# Patient Record
Sex: Male | Born: 1960 | Race: White | Hispanic: No | Marital: Married | Smoking: Never smoker
Health system: Southern US, Community
[De-identification: ages and names within clinical notes are randomized; demographics above are authoritative.]

## PROBLEM LIST (undated history)

## (undated) DIAGNOSIS — E785 Hyperlipidemia, unspecified: Secondary | ICD-10-CM

## (undated) DIAGNOSIS — K219 Gastro-esophageal reflux disease without esophagitis: Secondary | ICD-10-CM

## (undated) DIAGNOSIS — G4730 Sleep apnea, unspecified: Secondary | ICD-10-CM

## (undated) DIAGNOSIS — I10 Essential (primary) hypertension: Secondary | ICD-10-CM

## (undated) DIAGNOSIS — K635 Polyp of colon: Secondary | ICD-10-CM

## (undated) DIAGNOSIS — M109 Gout, unspecified: Secondary | ICD-10-CM

## (undated) DIAGNOSIS — F419 Anxiety disorder, unspecified: Secondary | ICD-10-CM

## (undated) DIAGNOSIS — I4891 Unspecified atrial fibrillation: Secondary | ICD-10-CM

## (undated) HISTORY — DX: Unspecified atrial fibrillation: I48.91

## (undated) HISTORY — DX: Hyperlipidemia, unspecified: E78.5

## (undated) HISTORY — PX: LEG SURGERY: SHX1003

## (undated) HISTORY — DX: Sleep apnea, unspecified: G47.30

## (undated) HISTORY — PX: HIP SURGERY: SHX245

## (undated) HISTORY — PX: CARDIAC ELECTROPHYSIOLOGY MAPPING AND ABLATION: SHX1292

## (undated) HISTORY — PX: CARDIOVERSION: SHX1299

## (undated) HISTORY — DX: Polyp of colon: K63.5

## (undated) HISTORY — DX: Gout, unspecified: M10.9

## (undated) HISTORY — DX: Anxiety disorder, unspecified: F41.9

## (undated) HISTORY — DX: Essential (primary) hypertension: I10

---

## 2011-04-04 ENCOUNTER — Encounter: Payer: Self-pay | Admitting: Gastroenterology

## 2011-04-04 ENCOUNTER — Ambulatory Visit (INDEPENDENT_AMBULATORY_CARE_PROVIDER_SITE_OTHER): Payer: 59 | Admitting: Gastroenterology

## 2011-04-04 DIAGNOSIS — K219 Gastro-esophageal reflux disease without esophagitis: Secondary | ICD-10-CM

## 2011-04-04 DIAGNOSIS — Z809 Family history of malignant neoplasm, unspecified: Secondary | ICD-10-CM

## 2011-04-04 MED ORDER — PEG-KCL-NACL-NASULF-NA ASC-C 100 G PO SOLR: 1.0000 | ORAL | Status: DC

## 2011-04-04 NOTE — Patient Instructions (Signed)
You will be set up for a colonoscopy. We will check to see if it makes a difference in copay at [facility] or [facility]. Needs to be done with propofol. Cutting back on daily alcohol intake is probably a good idea.

## 2011-04-04 NOTE — Progress Notes (Signed)
HPI: This is a  very pleasant 50 year old man who is here with his wife today.  His father died of colon cancer in his late 72s.  He has never had colon problems: no bleeding, no constipation, dramatic diarrhea.  Overall he has lost 12 pounds intentionally.  Gained a lot of weight after falling from a ladder, broke several bones, laid up for several months.  He drinks 6-7 beers a day. 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 He has no dysphasia, no overt GI bleeding.    Review of systems: Pertinent positive and negative review of systems were noted in the above HPI section.  All other review of systems was otherwise negative.   Past Medical History, Past Surgical History, Family History, Social History, Current Medications, Allergies were all reviewed with the patient via [facility] electronic medical record system.   Physical Exam: BP 132/80  Pulse 92  Ht 6' (1.829 m)  Wt 238 lb (107.956 kg)  BMI 32.28 kg/m2 Constitutional: generally well-appearing Psychiatric: alert and oriented x3 Eyes: extraocular movements intact Mouth: oral pharynx moist, no lesions Neck: supple no lymphadenopathy Cardiovascular: heart regular rate and rhythm Lungs: clear to auscultation bilaterally Abdomen: soft, nontender, nondistended, no obvious ascites, no peritoneal signs, normal bowel sounds Extremities: no lower extremity edema bilaterally Skin: no lesions on visible extremities    Assessment and plan: 50 y.o. male with family history of colon cancer  He drinks 6-8 beers a day and any endoscopic procedure would probably be safest with propofol. We will schedule that to be done at his soonest convenience. He does have chronic intermittent GERD but no alarm symptoms. I recommended he cut back on his alcohol intake as that is undoubtedly contributing to his heartburn. I do not think he needs endoscopic evaluation at this point he has no signs or symptoms of  chronic liver disease.

## 2011-05-05 ENCOUNTER — Other Ambulatory Visit: Payer: 59 | Admitting: Gastroenterology

## 2015-12-15 ENCOUNTER — Telehealth: Payer: Self-pay | Admitting: Internal Medicine

## 2015-12-15 NOTE — Telephone Encounter (Signed)
LMOM for a return call.  

## 2015-12-15 NOTE — Telephone Encounter (Signed)
Pt has been scheduled an OV with Walden Field, NP for 12/18/2015 at 10:30 AM.  He has [facility] and per Marzetta Board he does not need a referral.

## 2015-12-15 NOTE — Telephone Encounter (Signed)
[phone] or [phone] [facility], wife) calling to set up husband's first colonoscopy and she said he is not having any problems and was advised to have his list of meds, insurance card available and the triage nurse would be in touch.

## 2015-12-15 NOTE — Telephone Encounter (Signed)
I be happy to take him on as a new patient. We'll need to get him in to see [place] in preparation for TCS with propofol

## 2015-12-15 NOTE — Telephone Encounter (Signed)
I called and spoke to pt's wife, Jenny Reichmann. She said pt has never had a colonoscopy. He does have a family hx of colon cancer in his father who died from colon cancer in his early 22's.  He saw Dr. Ardis Hughs at [facility] in 03/2011 and never did do the colonoscopy. He said the prep was too extreme. ( See copy in chart). He would like Dr. Gala Romney to do the colonoscopy. Michela Pitcher a friend of his Charlann Boxer sees Dr. Gala Romney and really does like him and recommended him. I told Jenny Reichmann I would have to check with my office since I am not to schedule appts if a pt has seen another GI. She would like a call back as soon as possible, but said PT WILL NOT GO BACK TO THAT OFFICE. Sending to Sofie Rower to advise!

## 2015-12-15 NOTE — Telephone Encounter (Signed)
Routing to Dr. Gala Romney for his opinion

## 2015-12-18 ENCOUNTER — Ambulatory Visit (INDEPENDENT_AMBULATORY_CARE_PROVIDER_SITE_OTHER): Payer: 59 | Admitting: Nurse Practitioner

## 2015-12-18 ENCOUNTER — Other Ambulatory Visit: Payer: Self-pay

## 2015-12-18 ENCOUNTER — Encounter: Payer: Self-pay | Admitting: Nurse Practitioner

## 2015-12-18 VITALS — BP 141/97 | HR 104 | Temp 97.0°F | Ht 72.0 in | Wt 255.0 lb

## 2015-12-18 DIAGNOSIS — Z800 Family history of malignant neoplasm of digestive organs: Secondary | ICD-10-CM | POA: Insufficient documentation

## 2015-12-18 DIAGNOSIS — Z1211 Encounter for screening for malignant neoplasm of colon: Secondary | ICD-10-CM

## 2015-12-18 MED ORDER — PEG 3350-KCL-NA BICARB-NACL 420 G PO SOLR: 4000.0000 mL | Freq: Once | ORAL | Status: DC

## 2015-12-18 NOTE — Assessment & Plan Note (Signed)
Patient with no previous colonoscopy. High risk due to family history of colon cancer as noted above. Asymptomatic from a GI standpoint. At this point we'll move forward with colonoscopy for initial screening of this high risk patient.  Proceed with TCS in the OR with propofol/MAC with Dr. Gala Romney in near future: the risks, benefits, and alternatives have been discussed with the patient in detail. The patient states understanding and desires to proceed.  The patient is not on any anticoagulants, anxiolytics, her chronic pain medications. Does take Lexapro daily. Also drinks approximately 8 beers a day for some time. We'll proceed with the procedure and the OR with propofol/MAC to promote adequate sedation given chronic alcohol use.

## 2015-12-18 NOTE — Progress Notes (Signed)
Primary Care Physician:  No PCP Per Patient Primary Gastroenterologist:  Dr. Gala Romney  Chief Complaint  Patient presents with  . Colonoscopy    HPI:   55 year old male presents for first-ever screening colonoscopy. Previously seen polyp our GI in 2012. At that time the recommended surveillance colonoscopy due to family history of colon cancer however that does not appear to been done. No colonoscopies or endoscopies noted in our system. Per family history patient's father died of colon cancer in his late 85s, he is unsure of point he was diagnosed.  Today he states he did not know his dad well and his history about him is limited. Denies abdominal pain, N/V, change in bowel habits, hematochezia, melena, fever, chills, unintentional weight loss. GERD symptoms well controlled with PPI. Denies chest pain, dyspnea, dizziness, lightheadedness, syncope, near syncope. Denies any other upper or lower GI symptoms.  Past Medical History  Diagnosis Date  . Anxiety   . Asthma   . Hypertension   . Hyperlipemia     Past Surgical History  Procedure Laterality Date  . Leg surgery      Right   . Hip surgery      right    Current Outpatient Prescriptions  Medication Sig Dispense Refill  . escitalopram (LEXAPRO) 20 MG tablet Take 20 mg by mouth daily.      . fish oil-omega-3 fatty acids 1000 MG capsule Take 2 g by mouth daily. Reported on 12/18/2015    . olmesartan (BENICAR) 40 MG tablet Take 40 mg by mouth daily.      . pantoprazole (PROTONIX) 40 MG tablet Take 40 mg by mouth 2 (two) times daily.      . rosuvastatin (CRESTOR) 5 MG tablet Take 5 mg by mouth daily.  6  . polyethylene glycol-electrolytes (NULYTELY/GOLYTELY) 420 g solution Take 4,000 mLs by mouth once. 4000 mL 0   No current facility-administered medications for this visit.    Allergies as of 12/18/2015  . (No Known Allergies)    Family History  Problem Relation Age of Onset  . Colon cancer Father     Unknown age of  onset; Pased away in late 37s  . Diabetes Mother     Social History   Social History  . Marital Status: Married    Spouse Name: N/A  . Number of Children: 0  . Years of Education: N/A   Occupational History  . Contractor     Self Employed   Social History Main Topics  . Smoking status: Never Smoker   . Smokeless tobacco: Never Used  . Alcohol Use: 0.0 oz/week    0 Standard drinks or equivalent per week     Comment: 6-8 daily   . Drug Use: No  . Sexual Activity: Not on file   Other Topics Concern  . Not on file   Social History Narrative   3 caffeine drinks daily     Review of Systems: General: Negative for anorexia, weight loss, fever, chills, fatigue, weakness. ENT: Negative for hoarseness, difficulty swallowing. CV: Negative for chest pain, angina, palpitations, peripheral edema.  Respiratory: Negative for dyspnea at rest, cough, sputum, wheezing.  GI: See history of present illness. MS: Admits chronic hip pain.   Derm: Negative for rash or itching. Endo: Negative for unusual weight change.  Heme: Negative for bruising or bleeding. Allergy: Negative for rash or hives.    Physical Exam: BP 141/97 mmHg  Pulse 104  Temp(Src) 97 F (36.1 C) (Oral)  Ht 6' (1.829 m)  Wt 255 lb (115.667 kg)  BMI 34.58 kg/m2 General:   Alert and oriented. Pleasant and cooperative. Well-nourished and well-developed.  Head:  Normocephalic and atraumatic. Eyes:  Without icterus, sclera clear and conjunctiva pink.  Ears:  Normal auditory acuity. Cardiovascular:  S1, S2 present without murmurs appreciated. Extremities without clubbing or edema. Respiratory:  Clear to auscultation bilaterally. No wheezes, rales, or rhonchi. No distress.  Gastrointestinal:  +BS, rounded but soft, non-tender and non-distended. No HSM noted. No guarding or rebound. No masses appreciated.  Rectal:  Deferred  Musculoskalatal:  Symmetrical without gross deformities. Limping gait. Skin:  Intact without  significant lesions or rashes. Neurologic:  Alert and oriented x4;  grossly normal neurologically. Psych:  Alert and cooperative. Normal mood and affect. Heme/Lymph/Immune: No excessive bruising noted.    12/18/2015 12:13 PM

## 2015-12-18 NOTE — Assessment & Plan Note (Signed)
Positive family history of colon cancer in his 52 who passed away in his late 10s from [place]. Didn't know his father well so he's unsure of the age of diagnosis. Generally asymptomatic from a GI standpoint, no previous colonoscopy. Will proceed with high risk initial screening as noted below.

## 2015-12-18 NOTE — Patient Instructions (Signed)
1. We will schedule your procedure for you. 2. Further recommendations to be based on results of your procedure. 3. Return for follow-up as needed based on the recommendations after your procedure or for any new stomach or colon symptoms.

## 2015-12-22 NOTE — Progress Notes (Signed)
No pcp per patient 

## 2016-01-05 NOTE — Patient Instructions (Signed)
Julian Parsons  01/05/2016     @PREFPERIOPPHARMACY @   Your procedure is scheduled on  01/11/2016  Report to [facility] at  700  A.M.  Call this number if you have problems the morning of surgery:  [phone]   Remember:  Do not eat food or drink liquids after midnight.  Take these medicines the morning of surgery with A SIP OF WATER  Lexapro, benicar, protonix.   Do not wear jewelry, make-up or nail polish.  Do not wear lotions, powders, or perfumes.  You may wear deodorant.  Do not shave 48 hours prior to surgery.  Men may shave face and neck.  Do not bring valuables to the hospital.  [facility] is not responsible for any belongings or valuables.  Contacts, dentures or bridgework may not be worn into surgery.  Leave your suitcase in the car.  After surgery it may be brought to your room.  For patients admitted to the hospital, discharge time will be determined by your treatment team.  Patients discharged the day of surgery will not be allowed to drive home.   Name and phone number of your driver:   family Special instructions:  Follow the diet and prep instructions given to you by Dr Roseanne Kaufman office.  Please read over the following fact sheets that you were given. Coughing and Deep Breathing, Surgical Site Infection Prevention, Anesthesia Post-op Instructions and Care and Recovery After Surgery      Colonoscopy A colonoscopy is an exam to look at the entire large intestine (colon). This exam can help find problems such as tumors, polyps, inflammation, and areas of bleeding. The exam takes about 1 hour.  LET [facility] CARE PROVIDER KNOW ABOUT:   Any allergies you have.  All medicines you are taking, including vitamins, herbs, eye drops, creams, and over-the-counter medicines.  Previous problems you or members of your family have had with the use of anesthetics.  Any blood disorders you have.  Previous surgeries you have had.  Medical conditions you  have. RISKS AND COMPLICATIONS  Generally, this is a safe procedure. However, as with any procedure, complications can occur. Possible complications include:  Bleeding.  Tearing or rupture of the colon wall.  Reaction to medicines given during the exam.  Infection (rare). BEFORE THE PROCEDURE   Ask your health care provider about changing or stopping your regular medicines.  You may be prescribed an oral bowel prep. This involves drinking a large amount of medicated liquid, starting the day before your procedure. The liquid will cause you to have multiple loose stools until your stool is almost clear or light green. This cleans out your colon in preparation for the procedure.  Do not eat or drink anything else once you have started the bowel prep, unless your health care provider tells you it is safe to do so.  Arrange for someone to drive you home after the procedure. PROCEDURE   You will be given medicine to help you relax (sedative).  You will lie on your side with your knees bent.  A long, flexible tube with a light and camera on the end (colonoscope) will be inserted through the rectum and into the colon. The camera sends video back to a computer screen as it moves through the colon. The colonoscope also releases carbon dioxide gas to inflate the colon. This helps your health care provider see the area better.  During the exam, your health care provider may  take a small tissue sample (biopsy) to be examined under a microscope if any abnormalities are found.  The exam is finished when the entire colon has been viewed. AFTER THE PROCEDURE   Do not drive for 24 hours after the exam.  You may have a small amount of blood in your stool.  You may pass moderate amounts of gas and have mild abdominal cramping or bloating. This is caused by the gas used to inflate your colon during the exam.  Ask when your test results will be ready and how you will get your results. Make sure you  get your test results.   This information is not intended to replace advice given to you by your health care provider. Make sure you discuss any questions you have with your health care provider.   Document Released: 11/18/2000 Document Revised: 09/11/2013 Document Reviewed: 07/29/2013 Elsevier Interactive Patient Education 2016 Elsevier Inc. Colonoscopy, Care After Refer to this sheet in the next few weeks. These instructions provide you with information on caring for yourself after your procedure. Your health care provider may also give you more specific instructions. Your treatment has been planned according to current medical practices, but problems sometimes occur. Call your health care provider if you have any problems or questions after your procedure. WHAT TO EXPECT AFTER THE PROCEDURE  After your procedure, it is typical to have the following:  A small amount of blood in your stool.  Moderate amounts of gas and mild abdominal cramping or bloating. HOME CARE INSTRUCTIONS  Do not drive, operate machinery, or sign important documents for 24 hours.  You may shower and resume your regular physical activities, but move at a slower pace for the first 24 hours.  Take frequent rest periods for the first 24 hours.  Walk around or put a warm pack on your abdomen to help reduce abdominal cramping and bloating.  Drink enough fluids to keep your urine clear or pale yellow.  You may resume your normal diet as instructed by your health care provider. Avoid heavy or fried foods that are hard to digest.  Avoid drinking alcohol for 24 hours or as instructed by your health care provider.  Only take over-the-counter or prescription medicines as directed by your health care provider.  If a tissue sample (biopsy) was taken during your procedure:  Do not take aspirin or blood thinners for 7 days, or as instructed by your health care provider.  Do not drink alcohol for 7 days, or as instructed  by your health care provider.  Eat soft foods for the first 24 hours. SEEK MEDICAL CARE IF: You have persistent spotting of blood in your stool 2-3 days after the procedure. SEEK IMMEDIATE MEDICAL CARE IF:  You have more than a small spotting of blood in your stool.  You pass large blood clots in your stool.  Your abdomen is swollen (distended).  You have nausea or vomiting.  You have a fever.  You have increasing abdominal pain that is not relieved with medicine.   This information is not intended to replace advice given to you by your health care provider. Make sure you discuss any questions you have with your health care provider.   Document Released: 07/05/2004 Document Revised: 09/11/2013 Document Reviewed: 07/29/2013 Elsevier Interactive Patient Education 2016 Elsevier Inc. PATIENT INSTRUCTIONS POST-ANESTHESIA  IMMEDIATELY FOLLOWING SURGERY:  Do not drive or operate machinery for the first twenty four hours after surgery.  Do not make any important decisions for twenty four  hours after surgery or while taking narcotic pain medications or sedatives.  If you develop intractable nausea and vomiting or a severe headache please notify your doctor immediately.  FOLLOW-UP:  Please make an appointment with your surgeon as instructed. You do not need to follow up with anesthesia unless specifically instructed to do so.  WOUND CARE INSTRUCTIONS (if applicable):  Keep a dry clean dressing on the anesthesia/puncture wound site if there is drainage.  Once the wound has quit draining you may leave it open to air.  Generally you should leave the bandage intact for twenty four hours unless there is drainage.  If the epidural site drains for more than 36-48 hours please call the anesthesia department.  QUESTIONS?:  Please feel free to call your physician or the hospital operator if you have any questions, and they will be happy to assist you.

## 2016-01-06 ENCOUNTER — Encounter (HOSPITAL_COMMUNITY)
Admission: RE | Admit: 2016-01-06 | Discharge: 2016-01-06 | Disposition: A | Discharge status: Home / Self Care | Payer: 59 | Source: Ambulatory Visit | Attending: Internal Medicine | Admitting: Internal Medicine

## 2016-01-06 ENCOUNTER — Encounter (HOSPITAL_COMMUNITY): Payer: Self-pay

## 2016-01-06 ENCOUNTER — Other Ambulatory Visit: Payer: Self-pay

## 2016-01-06 DIAGNOSIS — Z808 Family history of malignant neoplasm of other organs or systems: Secondary | ICD-10-CM | POA: Diagnosis not present

## 2016-01-06 DIAGNOSIS — Z01810 Encounter for preprocedural cardiovascular examination: Secondary | ICD-10-CM | POA: Diagnosis not present

## 2016-01-06 DIAGNOSIS — Z01812 Encounter for preprocedural laboratory examination: Secondary | ICD-10-CM | POA: Diagnosis not present

## 2016-01-06 HISTORY — DX: Gastro-esophageal reflux disease without esophagitis: K21.9

## 2016-01-06 LAB — BASIC METABOLIC PANEL
Anion gap: 8 (ref 5–15)
BUN: 12 mg/dL (ref 6–20)
CALCIUM: 8.9 mg/dL (ref 8.9–10.3)
CHLORIDE: 108 mmol/L (ref 101–111)
CO2: 25 mmol/L (ref 22–32)
CREATININE: 0.73 mg/dL (ref 0.61–1.24)
GFR calc Af Amer: >60 mL/min (ref >60)
GFR calc non Af Amer: >60 mL/min (ref >60)
GLUCOSE: 107 mg/dL — AB (ref 65–99)
Potassium: 4.7 mmol/L (ref 3.5–5.1)
Sodium: 141 mmol/L (ref 135–145)

## 2016-01-06 LAB — CBC
HEMATOCRIT: 43.2 % (ref 39.0–52.0)
HEMOGLOBIN: 14.1 g/dL (ref 13.0–17.0)
MCH: 33 pg (ref 26.0–34.0)
MCHC: 32.6 g/dL (ref 30.0–36.0)
MCV: 101.2 fL — AB (ref 78.0–100.0)
Platelets: 215 10*3/uL (ref 150–400)
RBC: 4.27 MIL/uL (ref 4.22–5.81)
RDW: 12.1 % (ref 11.5–15.5)
WBC: 5.5 10*3/uL (ref 4.0–10.5)

## 2016-01-11 ENCOUNTER — Encounter (HOSPITAL_COMMUNITY)
Admission: RE | Disposition: A | Discharge status: Home / Self Care | Payer: Self-pay | Source: Ambulatory Visit | Attending: Internal Medicine

## 2016-01-11 ENCOUNTER — Ambulatory Visit (HOSPITAL_COMMUNITY): Payer: 59 | Admitting: Anesthesiology

## 2016-01-11 ENCOUNTER — Ambulatory Visit (HOSPITAL_COMMUNITY)
Admission: RE | Admit: 2016-01-11 | Discharge: 2016-01-11 | Disposition: A | Discharge status: Home / Self Care | Payer: 59 | Source: Ambulatory Visit | Attending: Internal Medicine | Admitting: Internal Medicine

## 2016-01-11 ENCOUNTER — Encounter (HOSPITAL_COMMUNITY): Payer: Self-pay | Admitting: *Deleted

## 2016-01-11 DIAGNOSIS — K219 Gastro-esophageal reflux disease without esophagitis: Secondary | ICD-10-CM | POA: Insufficient documentation

## 2016-01-11 DIAGNOSIS — F419 Anxiety disorder, unspecified: Secondary | ICD-10-CM | POA: Insufficient documentation

## 2016-01-11 DIAGNOSIS — D128 Benign neoplasm of rectum: Secondary | ICD-10-CM | POA: Insufficient documentation

## 2016-01-11 DIAGNOSIS — D120 Benign neoplasm of cecum: Secondary | ICD-10-CM | POA: Diagnosis not present

## 2016-01-11 DIAGNOSIS — Z800 Family history of malignant neoplasm of digestive organs: Secondary | ICD-10-CM | POA: Insufficient documentation

## 2016-01-11 DIAGNOSIS — Z6834 Body mass index (BMI) 34.0-34.9, adult: Secondary | ICD-10-CM | POA: Insufficient documentation

## 2016-01-11 DIAGNOSIS — I10 Essential (primary) hypertension: Secondary | ICD-10-CM | POA: Diagnosis not present

## 2016-01-11 DIAGNOSIS — Z86010 Personal history of colonic polyps: Secondary | ICD-10-CM | POA: Insufficient documentation

## 2016-01-11 DIAGNOSIS — E785 Hyperlipidemia, unspecified: Secondary | ICD-10-CM | POA: Diagnosis not present

## 2016-01-11 DIAGNOSIS — Z1211 Encounter for screening for malignant neoplasm of colon: Secondary | ICD-10-CM | POA: Diagnosis present

## 2016-01-11 DIAGNOSIS — Z79899 Other long term (current) drug therapy: Secondary | ICD-10-CM | POA: Insufficient documentation

## 2016-01-11 HISTORY — PX: COLONOSCOPY WITH PROPOFOL: SHX5780

## 2016-01-11 SURGERY — COLONOSCOPY WITH PROPOFOL
Anesthesia: Monitor Anesthesia Care

## 2016-01-11 MED ORDER — FENTANYL CITRATE (PF) 100 MCG/2ML IJ SOLN
25.0000 ug | INTRAMUSCULAR | Status: DC | PRN
Start: 2016-01-11 — End: 2016-01-11

## 2016-01-11 MED ORDER — PROPOFOL 10 MG/ML IV BOLUS
INTRAVENOUS | Status: AC
  Filled 2016-01-11: qty 40

## 2016-01-11 MED ORDER — MIDAZOLAM HCL 2 MG/2ML IJ SOLN
INTRAMUSCULAR | Status: AC
  Filled 2016-01-11: qty 4

## 2016-01-11 MED ORDER — ONDANSETRON HCL 4 MG/2ML IJ SOLN: 4.0000 mg | Freq: Once | INTRAMUSCULAR | Status: DC | PRN

## 2016-01-11 MED ORDER — PROPOFOL 500 MG/50ML IV EMUL
INTRAVENOUS | Status: DC | PRN
  Administered 2016-01-11: 125 ug/kg/min via INTRAVENOUS
  Administered 2016-01-11: 75 ug/kg/min via INTRAVENOUS

## 2016-01-11 MED ORDER — MIDAZOLAM HCL 2 MG/2ML IJ SOLN
1.0000 mg | INTRAMUSCULAR | Status: DC | PRN
  Administered 2016-01-11: 2 mg via INTRAVENOUS

## 2016-01-11 MED ORDER — MIDAZOLAM HCL 5 MG/5ML IJ SOLN
INTRAMUSCULAR | Status: DC | PRN
  Administered 2016-01-11: 2 mg via INTRAVENOUS

## 2016-01-11 MED ORDER — LACTATED RINGERS IV SOLN
INTRAVENOUS | Status: DC
  Administered 2016-01-11: 08:00:00 via INTRAVENOUS

## 2016-01-11 MED ORDER — MIDAZOLAM HCL 2 MG/2ML IJ SOLN
INTRAMUSCULAR | Status: AC
  Filled 2016-01-11: qty 2

## 2016-01-11 NOTE — Interval H&P Note (Signed)
History and Physical Interval Note:  01/11/2016 7:41 AM  Julian Parsons  has presented today for surgery, with the diagnosis of screening colonoscopy, family history of colon cancer  The various methods of treatment have been discussed with the patient and family. After consideration of risks, benefits and other options for treatment, the patient has consented to  Procedure(s) with comments: COLONOSCOPY WITH PROPOFOL (N/A) - 0815-moved to 830  as a surgical intervention .  The patient's history has been reviewed, patient examined, no change in status, stable for surgery.  I have reviewed the patient's chart and labs.  Questions were answered to the patient's satisfaction.     Julian Parsons  No change. First-ever high risk screening colonoscopy per plan.  The risks, benefits, limitations, alternatives and imponderables have been reviewed with the patient. Questions have been answered. All parties are agreeable.

## 2016-01-11 NOTE — Discharge Instructions (Signed)
°Colonoscopy °Discharge Instructions ° °Read the instructions outlined below and refer to this sheet in the next few weeks. These discharge instructions provide you with general information on caring for yourself after you leave the hospital. Your doctor may also give you specific instructions. While your treatment has been planned according to the most current medical practices available, unavoidable complications occasionally occur. If you have any problems or questions after discharge, call Dr. Rourk at [phone]. °ACTIVITY °· You may resume your regular activity, but move at a slower pace for the next 24 hours.  °· Take frequent rest periods for the next 24 hours.  °· Walking will help get rid of the air and reduce the bloated feeling in your belly (abdomen).  °· No driving for 24 hours (because of the medicine (anesthesia) used during the test).   °· Do not sign any important legal documents or operate any machinery for 24 hours (because of the anesthesia used during the test).  °NUTRITION °· Drink plenty of fluids.  °· You may resume your normal diet as instructed by your doctor.  °· Begin with a light meal and progress to your normal diet. Heavy or fried foods are harder to digest and may make you feel sick to your stomach (nauseated).  °· Avoid alcoholic beverages for 24 hours or as instructed.  °MEDICATIONS °· You may resume your normal medications unless your doctor tells you otherwise.  °WHAT YOU CAN EXPECT TODAY °· Some feelings of bloating in the abdomen.  °· Passage of more gas than usual.  °· Spotting of blood in your stool or on the toilet paper.  °IF YOU HAD POLYPS REMOVED DURING THE COLONOSCOPY: °· No aspirin products for 7 days or as instructed.  °· No alcohol for 7 days or as instructed.  °· Eat a soft diet for the next 24 hours.  °FINDING OUT THE RESULTS OF YOUR TEST °Not all test results are available during your visit. If your test results are not back during the visit, make an appointment  with your caregiver to find out the results. Do not assume everything is normal if you have not heard from your caregiver or the medical facility. It is important for you to follow up on all of your test results.  °SEEK IMMEDIATE MEDICAL ATTENTION IF: °· You have more than a spotting of blood in your stool.  °· Your belly is swollen (abdominal distention).  °· You are nauseated or vomiting.  °· You have a temperature over 101.  °· You have abdominal pain or discomfort that is severe or gets worse throughout the day.  ° °Polyp information provided ° °Further recommendations to follow pending review of pathology report ° °Colon Polyps °Polyps are lumps of extra tissue growing inside the body. Polyps can grow in the large intestine (colon). Most colon polyps are noncancerous (benign). However, some colon polyps can become cancerous over time. Polyps that are larger than a pea may be harmful. To be safe, caregivers remove and test all polyps. °CAUSES  °Polyps form when mutations in the genes cause your cells to grow and divide even though no more tissue is needed. °RISK FACTORS °There are a number of risk factors that can increase your chances of getting colon polyps. They include: °· Being older than 50 years. °· Family history of colon polyps or colon cancer. °· Long-term colon diseases, such as colitis or Crohn disease. °· Being overweight. °· Smoking. °· Being inactive. °· Drinking too much alcohol. °SYMPTOMS  °  Most small polyps do not cause symptoms. If symptoms are present, they may include: °· Blood in the stool. The stool may look dark red or black. °· Constipation or diarrhea that lasts longer than 1 week. °DIAGNOSIS °People often do not know they have polyps until their caregiver finds them during a regular checkup. Your caregiver can use 4 tests to check for polyps: °· Digital rectal exam. The caregiver wears gloves and feels inside the rectum. This test would find polyps only in the rectum. °· Barium enema.  The caregiver puts a liquid called barium into your rectum before taking X-rays of your colon. Barium makes your colon look white. Polyps are dark, so they are easy to see in the X-ray pictures. °· Sigmoidoscopy. A thin, flexible tube (sigmoidoscope) is placed into your rectum. The sigmoidoscope has a light and tiny camera in it. The caregiver uses the sigmoidoscope to look at the last third of your colon. °· Colonoscopy. This test is like sigmoidoscopy, but the caregiver looks at the entire colon. This is the most common method for finding and removing polyps. °TREATMENT  °Any polyps will be removed during a sigmoidoscopy or colonoscopy. The polyps are then tested for cancer. °PREVENTION  °To help lower your risk of getting more colon polyps: °· Eat plenty of fruits and vegetables. Avoid eating fatty foods. °· Do not smoke. °· Avoid drinking alcohol. °· Exercise every day. °· Lose weight if recommended by your caregiver. °· Eat plenty of calcium and folate. Foods that are rich in calcium include milk, cheese, and broccoli. Foods that are rich in folate include chickpeas, kidney beans, and spinach. °HOME CARE INSTRUCTIONS °Keep all follow-up appointments as directed by your caregiver. You may need periodic exams to check for polyps. °SEEK MEDICAL CARE IF: °You notice bleeding during a bowel movement. °  °This information is not intended to replace advice given to you by your health care provider. Make sure you discuss any questions you have with your health care provider. °  °Document Released: 08/17/2004 Document Revised: 12/12/2014 Document Reviewed: 01/31/2012 °Elsevier Interactive Patient Education ©2016 Elsevier Inc. ° °

## 2016-01-11 NOTE — H&P (View-Only) (Signed)
Primary Care Physician:  No PCP Per Patient Primary Gastroenterologist:  Dr. Gala Romney  Chief Complaint  Patient presents with  . Colonoscopy    HPI:   55 year old male presents for first-ever screening colonoscopy. Previously seen polyp our GI in 2012. At that time the recommended surveillance colonoscopy due to family history of colon cancer however that does not appear to been done. No colonoscopies or endoscopies noted in our system. Per family history patient's father died of colon cancer in his late 73s, he is unsure of point he was diagnosed.  Today he states he did not know his dad well and his history about him is limited. Denies abdominal pain, N/V, change in bowel habits, hematochezia, melena, fever, chills, unintentional weight loss. GERD symptoms well controlled with PPI. Denies chest pain, dyspnea, dizziness, lightheadedness, syncope, near syncope. Denies any other upper or lower GI symptoms.  Past Medical History  Diagnosis Date  . Anxiety   . Asthma   . Hypertension   . Hyperlipemia     Past Surgical History  Procedure Laterality Date  . Leg surgery      Right   . Hip surgery      right    Current Outpatient Prescriptions  Medication Sig Dispense Refill  . escitalopram (LEXAPRO) 20 MG tablet Take 20 mg by mouth daily.      . fish oil-omega-3 fatty acids 1000 MG capsule Take 2 g by mouth daily. Reported on 12/18/2015    . olmesartan (BENICAR) 40 MG tablet Take 40 mg by mouth daily.      . pantoprazole (PROTONIX) 40 MG tablet Take 40 mg by mouth 2 (two) times daily.      . rosuvastatin (CRESTOR) 5 MG tablet Take 5 mg by mouth daily.  6  . polyethylene glycol-electrolytes (NULYTELY/GOLYTELY) 420 g solution Take 4,000 mLs by mouth once. 4000 mL 0   No current facility-administered medications for this visit.    Allergies as of 12/18/2015  . (No Known Allergies)    Family History  Problem Relation Age of Onset  . Colon cancer Father     Unknown age of  onset; Pased away in late 81s  . Diabetes Mother     Social History   Social History  . Marital Status: Married    Spouse Name: N/A  . Number of Children: 0  . Years of Education: N/A   Occupational History  . Contractor     Self Employed   Social History Main Topics  . Smoking status: Never Smoker   . Smokeless tobacco: Never Used  . Alcohol Use: 0.0 oz/week    0 Standard drinks or equivalent per week     Comment: 6-8 daily   . Drug Use: No  . Sexual Activity: Not on file   Other Topics Concern  . Not on file   Social History Narrative   3 caffeine drinks daily     Review of Systems: General: Negative for anorexia, weight loss, fever, chills, fatigue, weakness. ENT: Negative for hoarseness, difficulty swallowing. CV: Negative for chest pain, angina, palpitations, peripheral edema.  Respiratory: Negative for dyspnea at rest, cough, sputum, wheezing.  GI: See history of present illness. MS: Admits chronic hip pain.   Derm: Negative for rash or itching. Endo: Negative for unusual weight change.  Heme: Negative for bruising or bleeding. Allergy: Negative for rash or hives.    Physical Exam: BP 141/97 mmHg  Pulse 104  Temp(Src) 97 F (36.1 C) (Oral)  Ht 6' (1.829 m)  Wt 255 lb (115.667 kg)  BMI 34.58 kg/m2 General:   Alert and oriented. Pleasant and cooperative. Well-nourished and well-developed.  Head:  Normocephalic and atraumatic. Eyes:  Without icterus, sclera clear and conjunctiva pink.  Ears:  Normal auditory acuity. Cardiovascular:  S1, S2 present without murmurs appreciated. Extremities without clubbing or edema. Respiratory:  Clear to auscultation bilaterally. No wheezes, rales, or rhonchi. No distress.  Gastrointestinal:  +BS, rounded but soft, non-tender and non-distended. No HSM noted. No guarding or rebound. No masses appreciated.  Rectal:  Deferred  Musculoskalatal:  Symmetrical without gross deformities. Limping gait. Skin:  Intact without  significant lesions or rashes. Neurologic:  Alert and oriented x4;  grossly normal neurologically. Psych:  Alert and cooperative. Normal mood and affect. Heme/Lymph/Immune: No excessive bruising noted.    12/18/2015 12:13 PM

## 2016-01-11 NOTE — Transfer of Care (Signed)
Immediate Anesthesia Transfer of Care Note  Patient: Julian Parsons  Procedure(s) Performed: Procedure(s) with comments: COLONOSCOPY WITH PROPOFOL (N/A) - 0815-moved to 830   Patient Location: PACU  Anesthesia Type:MAC  Level of Consciousness: awake, alert  and patient cooperative  Airway & Oxygen Therapy: Patient Spontanous Breathing and Patient connected to face mask oxygen  Post-op Assessment: Report given to RN, Post -op Vital signs reviewed and stable and Patient moving all extremities  Post vital signs: Reviewed and stable  Last Vitals:  Filed Vitals:   01/11/16 0740 01/11/16 0745  BP: 151/95 160/113  Pulse:    Temp:    Resp: 18 10    Complications: No apparent anesthesia complications

## 2016-01-11 NOTE — Anesthesia Postprocedure Evaluation (Signed)
Anesthesia Post Note  Patient: Julian Parsons  Procedure(s) Performed: Procedure(s) (LRB): COLONOSCOPY WITH PROPOFOL (N/A)  Patient location during evaluation: PACU Anesthesia Type: MAC Level of consciousness: awake and alert and patient cooperative Pain management: pain level controlled Vital Signs Assessment: post-procedure vital signs reviewed and stable Respiratory status: spontaneous breathing and nonlabored ventilation Cardiovascular status: stable and blood pressure returned to baseline Postop Assessment: no signs of nausea or vomiting Anesthetic complications: no    Last Vitals:  Filed Vitals:   01/11/16 0740 01/11/16 0745  BP: 151/95 160/113  Pulse:    Temp:    Resp: 18 10    Last Pain: There were no vitals filed for this visit.               Lindy Pennisi J

## 2016-01-11 NOTE — Anesthesia Preprocedure Evaluation (Addendum)
Anesthesia Evaluation  Patient identified by MRN, date of birth, ID band Patient awake    Reviewed: Allergy & Precautions, NPO status , Patient's Chart, lab work & pertinent test results  Airway Mallampati: II  TM Distance: >3 FB Neck ROM: Full    Dental  (+) Teeth Intact, Dental Advisory Given   Pulmonary    Pulmonary exam normal        Cardiovascular hypertension, Pt. on medications Normal cardiovascular exam     Neuro/Psych Anxiety    GI/Hepatic GERD  Medicated and Controlled,  Endo/Other  Morbid obesity  Renal/GU      Musculoskeletal   Abdominal Normal abdominal exam  (+)   Peds  Hematology   Anesthesia Other Findings   Reproductive/Obstetrics                            Anesthesia Physical Anesthesia Plan  ASA: III  Anesthesia Plan: MAC   Post-op Pain Management:    Induction: Intravenous  Airway Management Planned: Mask  Additional Equipment:   Intra-op Plan:   Post-operative Plan:   Informed Consent: I have reviewed the patients History and Physical, chart, labs and discussed the procedure including the risks, benefits and alternatives for the proposed anesthesia with the patient or authorized representative who has indicated his/her understanding and acceptance.   Dental advisory given  Plan Discussed with: CRNA  Anesthesia Plan Comments:         Anesthesia Quick Evaluation

## 2016-01-11 NOTE — Op Note (Signed)
[facility] [address] [place], [address]   COLONOSCOPY PROCEDURE REPORT  PATIENT: Julian Parsons, Julian Parsons  MR#: [id number] BIRTHDATE: [birth year] , 54  yrs. old GENDER: male ENDOSCOPIST: R.  Garfield Cornea, MD FACP [facility] REFERRED [id number] PROCEDURE DATE:  02-09-2016 PROCEDURE:   Colonoscopy with snare polypectomy INDICATIONS:First-ever high risk colorectal cancer screening examination. MEDICATIONS: Deep sedation per Dr.  Patsey Berthold and associated ASA CLASS:       Class II  CONSENT: The risks, benefits, alternatives and imponderables including but not limited to bleeding, perforation as well as the possibility of a missed lesion have been reviewed.  The potential for biopsy, lesion removal, etc. have also been discussed. Questions have been answered.  All parties agreeable.  Please see the history and physical in the medical record for more information.  DESCRIPTION OF PROCEDURE:   After the risks benefits and alternatives of the procedure were thoroughly explained, informed consent was obtained.  The digital rectal exam revealed no abnormalities of the rectum.   The [id number]  endoscope was introduced through the anus and advanced to the cecum, which was identified by both the appendix and ileocecal valve. No adverse events experienced.   The quality of the prep was adequate  The instrument was then slowly withdrawn as the colon was fully examined. Estimated blood loss is zero unless otherwise noted in this procedure report.      COLON FINDINGS: (1) 5 mm polyp in the rectum at 4 cm from the anal verge; otherwise, remainder of the rectal mucosa appeared normal. The patient had (1) 4 mm polyp in the base the cecum.  Otherwise, the remainder of the colonic and rectal mucosa appeared normal.  The above-mentioned polyps were cold snare removed and recovered. Retroflexion was performed. .  Withdrawal time=15 minutes 0 seconds.  The scope was withdrawn and the  procedure completed. COMPLICATIONS: There were no immediate complications. EBL 2 mL ENDOSCOPIC IMPRESSION: Multiple rectal and colonic polyps?"removed as described above  RECOMMENDATIONS: Follow-up on pathology.  eSigned:  R. Garfield Cornea, MD Rosalita Chessman [facility] 02-09-2016 8:27 AM   cc:  CPT CODES: ICD CODES:  The ICD and CPT codes recommended by this software are interpretations from the data that the clinical staff has captured with the software.  The verification of the translation of this report to the ICD and CPT codes and modifiers is the sole responsibility of the health care institution and practicing physician where this report was generated.  [employer] will not be held responsible for the validity of the ICD and CPT codes included on this report.  AMA assumes no liability for data contained or not contained herein. CPT is a Designer, television/film set of the [employer].

## 2016-01-12 ENCOUNTER — Encounter: Payer: Self-pay | Admitting: Internal Medicine

## 2016-01-13 ENCOUNTER — Encounter (HOSPITAL_COMMUNITY): Payer: Self-pay | Admitting: Internal Medicine

## 2019-07-01 ENCOUNTER — Other Ambulatory Visit: Payer: Self-pay

## 2019-07-01 ENCOUNTER — Emergency Department (HOSPITAL_COMMUNITY)
Admission: EM | Admit: 2019-07-01 | Discharge: 2019-07-01 | Disposition: A | Discharge status: Home / Self Care | Payer: 59 | Attending: Emergency Medicine | Admitting: Emergency Medicine

## 2019-07-01 ENCOUNTER — Encounter (HOSPITAL_COMMUNITY): Payer: Self-pay | Admitting: *Deleted

## 2019-07-01 ENCOUNTER — Emergency Department (HOSPITAL_COMMUNITY): Payer: 59

## 2019-07-01 DIAGNOSIS — I10 Essential (primary) hypertension: Secondary | ICD-10-CM | POA: Diagnosis not present

## 2019-07-01 DIAGNOSIS — Z79899 Other long term (current) drug therapy: Secondary | ICD-10-CM | POA: Insufficient documentation

## 2019-07-01 DIAGNOSIS — E860 Dehydration: Secondary | ICD-10-CM | POA: Diagnosis not present

## 2019-07-01 DIAGNOSIS — R002 Palpitations: Secondary | ICD-10-CM | POA: Diagnosis present

## 2019-07-01 LAB — BASIC METABOLIC PANEL
Anion gap: 10 (ref 5–15)
BUN: 38 mg/dL — ABNORMAL HIGH (ref 6–20)
CO2: 23 mmol/L (ref 22–32)
Calcium: 9.9 mg/dL (ref 8.9–10.3)
Chloride: 105 mmol/L (ref 98–111)
Creatinine, Ser: 1.73 mg/dL — ABNORMAL HIGH (ref 0.61–1.24)
GFR calc Af Amer: 50 mL/min — ABNORMAL LOW (ref >60)
GFR calc non Af Amer: 43 mL/min — ABNORMAL LOW (ref >60)
Glucose, Bld: 109 mg/dL — ABNORMAL HIGH (ref 70–99)
Potassium: 5 mmol/L (ref 3.5–5.1)
Sodium: 138 mmol/L (ref 135–145)

## 2019-07-01 LAB — CBC
HCT: 40.5 % (ref 39.0–52.0)
Hemoglobin: 13.3 g/dL (ref 13.0–17.0)
MCH: 34.2 pg — ABNORMAL HIGH (ref 26.0–34.0)
MCHC: 32.8 g/dL (ref 30.0–36.0)
MCV: 104.1 fL — ABNORMAL HIGH (ref 80.0–100.0)
Platelets: 214 10*3/uL (ref 150–400)
RBC: 3.89 MIL/uL — ABNORMAL LOW (ref 4.22–5.81)
RDW: 12.8 % (ref 11.5–15.5)
WBC: 7.9 10*3/uL (ref 4.0–10.5)
nRBC: 0 % (ref 0.0–0.2)

## 2019-07-01 LAB — TROPONIN I (HIGH SENSITIVITY)
Troponin I (High Sensitivity): 19 ng/L — ABNORMAL HIGH (ref <18)
Troponin I (High Sensitivity): 19 ng/L — ABNORMAL HIGH (ref <18)

## 2019-07-01 MED ORDER — LACTATED RINGERS BOLUS PEDS: 2000.0000 mL | Freq: Once | INTRAVENOUS | Status: DC

## 2019-07-01 MED ORDER — LACTATED RINGERS IV BOLUS
1000.0000 mL | Freq: Once | INTRAVENOUS | Status: AC
  Administered 2019-07-01: 1000 mL via INTRAVENOUS

## 2019-07-01 NOTE — Discharge Instructions (Addendum)
Stop your hydrochlorothiazide for 1 week and then resume. I want you to have a repeat basic metabolic panel in 1-2 weeks. Make sure you are staying well hydrated.

## 2019-07-01 NOTE — ED Provider Notes (Signed)
[facility] EMERGENCY DEPARTMENT Provider Note   CSN: [id number] Arrival date & time: 07/01/19  1254     History   Chief Complaint Chief Complaint  Patient presents with  . Irregular Heart Beat  . Weakness  . Shortness of Breath    HPI Julian Parsons is a 58 y.o. male.     HPI   58yM with palpitations and dyspnea. Worsening over the past several weeks. Will feel like his heart is racing. Sometimes feels out of energy with this. No acute pain. No unusual leg pain or swelling. Does construction work outside. Doesn't always stay on top of hydration. Drinks several beeers most evenings.   Past Medical History:  Diagnosis Date  . Anxiety   . GERD (gastroesophageal reflux disease)   . Hyperlipemia   . Hypertension     Patient Active Problem List   Diagnosis Date Noted  . History of colonic polyps   . Family history of colon cancer 12/18/2015  . Encounter for screening colonoscopy 12/18/2015    Past Surgical History:  Procedure Laterality Date  . COLONOSCOPY WITH PROPOFOL N/A 01/11/2016   Procedure: COLONOSCOPY WITH PROPOFOL;  Surgeon: Daneil Dolin, MD;  Location: [facility];  Service: Endoscopy;  Laterality: N/A;  0815-moved to 830   . HIP SURGERY     right  . LEG SURGERY     Right         Home Medications    Prior to Admission medications   Medication Sig Start Date End Date Taking? Authorizing Provider  escitalopram (LEXAPRO) 20 MG tablet Take 20 mg by mouth daily.      [provider]  fish oil-omega-3 fatty acids 1000 MG capsule Take 2 g by mouth daily. Reported on 12/18/2015    [provider]  olmesartan (BENICAR) 40 MG tablet Take 40 mg by mouth daily.      [provider]  pantoprazole (PROTONIX) 40 MG tablet Take 40 mg by mouth 2 (two) times daily.      [provider]  polyethylene glycol-electrolytes (NULYTELY/GOLYTELY) 420 g solution Take 4,000 mLs by mouth once. 12/18/15   Carlis Stable, NP  rosuvastatin (CRESTOR)  5 MG tablet Take 5 mg by mouth daily. 12/03/15   [provider]    Family History Family History  Problem Relation Age of Onset  . Colon cancer Father        Unknown age of onset; Pased away in late 64s  . Diabetes Mother     Social History Social History   Tobacco Use  . Smoking status: Never Smoker  . Smokeless tobacco: Never Used  Substance Use Topics  . Alcohol use: Yes    Alcohol/week: 0.0 standard drinks    Comment: 6-8 daily   . Drug use: No     Allergies   Patient has no known allergies.   Review of Systems Review of Systems All systems reviewed and negative, other than as noted in HPI.   Physical Exam Updated Vital Signs BP (!) 140/96 (BP Location: Right Arm)   Pulse (!) 103   Temp 98.6 F (37 C) (Oral)   Resp 17   Ht 5\' 10"  (1.778 m)   Wt 120.2 kg   SpO2 100%   BMI 38.02 kg/m   Physical Exam Vitals signs and nursing note reviewed.  Constitutional:      General: He is not in acute distress.    Appearance: He is well-developed.  HENT:  Head: Normocephalic and atraumatic.  Eyes:     General:        Right eye: No discharge.        Left eye: No discharge.     Conjunctiva/sclera: Conjunctivae normal.  Neck:     Musculoskeletal: Neck supple.  Cardiovascular:     Rate and Rhythm: Regular rhythm. Tachycardia present.     Heart sounds: Normal heart sounds. No murmur. No friction rub. No gallop.   Pulmonary:     Effort: Pulmonary effort is normal. No respiratory distress.     Breath sounds: Normal breath sounds.  Abdominal:     General: There is no distension.     Palpations: Abdomen is soft.     Tenderness: There is no abdominal tenderness.  Musculoskeletal:        General: No tenderness.     Comments: Lower extremities symmetric as compared to each other. No calf tenderness. Negative Homan's. No palpable cords.   Skin:    General: Skin is warm and dry.  Neurological:     Mental Status: He is alert.  Psychiatric:         Behavior: Behavior normal.        Thought Content: Thought content normal.      ED Treatments / Results  Labs (all labs ordered are listed, but only abnormal results are displayed) Labs Reviewed  BASIC METABOLIC PANEL - Abnormal; Notable for the following components:      Result Value   Glucose, Bld 109 (*)    BUN 38 (*)    Creatinine, Ser 1.73 (*)    GFR calc non Af Amer 43 (*)    GFR calc Af Amer 50 (*)    All other components within normal limits  CBC - Abnormal; Notable for the following components:   RBC 3.89 (*)    MCV 104.1 (*)    MCH 34.2 (*)    All other components within normal limits  TROPONIN I (HIGH SENSITIVITY) - Abnormal; Notable for the following components:   Troponin I (High Sensitivity) 19 (*)    All other components within normal limits  TROPONIN I (HIGH SENSITIVITY) - Abnormal; Notable for the following components:   Troponin I (High Sensitivity) 19 (*)    All other components within normal limits    EKG EKG Interpretation  Date/Time:  Monday July 01 2019 13:18:27 EDT Ventricular Rate:  111 PR Interval:  152 QRS Duration: 72 QT Interval:  314 QTC Calculation: 427 R Axis:   43 Text Interpretation:  Sinus tachycardia with occasional Premature ventricular complexes Otherwise normal ECG Confirmed by Virgel Manifold [phone]) on 07/01/2019 5:25:00 PM   Radiology Dg Chest 2 View  Result Date: 07/01/2019 CLINICAL DATA:  Palpitations, tachycardia, shortness of breath, hypertension EXAM: CHEST - 2 VIEW COMPARISON:  None FINDINGS: Minimal enlargement of cardiac silhouette. Mediastinal contours and pulmonary vascularity normal. Atelectasis at LEFT base. Remaining lungs clear. No pleural effusion or pneumothorax. Scattered endplate spur formation thoracic spine. IMPRESSION: Enlargement of cardiac silhouette with LEFT basilar atelectasis. Electronically Signed   By: Lavonia Dana M.D.   On: 07/01/2019 13:40    Procedures Procedures (including critical care time)   Medications Ordered in ED Medications - No data to display   Initial Impression / Assessment and Plan / ED Course  I have reviewed the triage vital signs and the nursing notes.  Pertinent labs & imaging results that were available during my care of the patient were reviewed by me and considered  in my medical decision making (see chart for details).        58yM with palpitations. Suspect from dehydration. 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 He doesn't always make an effort to stay well hydrated. Drinks multipel beers most evenings. Feel better with IVF. Advised to hold HCTZ. Stay well hydrated. PCP FU.   It has been determined that no acute conditions requiring further emergency intervention are present at this time. The patient has been advised of the diagnosis and plan. I reviewed any labs and imaging including any potential incidental findings. I have reviewed nursing notes and appropriate previous records. We have discussed signs and symptoms that warrant return to the ED and they are listed in the discharge instructions.      Final Clinical Impressions(s) / ED Diagnoses   Final diagnoses:  Dehydration    ED Discharge Orders    None       Virgel Manifold, MD 07/03/19 [phone]

## 2019-07-01 NOTE — ED Triage Notes (Signed)
Pt reports his heart beating fast off/on every day for the last week. Reports he gets short of breath when he feels like it is beating too fast. Was told about one month ago that he is anemic. Feels weak most days .

## 2019-07-01 NOTE — ED Notes (Signed)
Pt updated regarding his status and vitals rechecked. No new complaints.

## 2019-07-29 ENCOUNTER — Encounter: Payer: Self-pay | Admitting: Gastroenterology

## 2019-07-29 ENCOUNTER — Other Ambulatory Visit: Payer: Self-pay

## 2019-07-29 ENCOUNTER — Other Ambulatory Visit: Payer: Self-pay | Admitting: *Deleted

## 2019-07-29 ENCOUNTER — Encounter

## 2019-07-29 ENCOUNTER — Encounter: Payer: Self-pay | Admitting: *Deleted

## 2019-07-29 ENCOUNTER — Telehealth: Payer: Self-pay | Admitting: *Deleted

## 2019-07-29 ENCOUNTER — Ambulatory Visit: Payer: 59 | Admitting: Gastroenterology

## 2019-07-29 VITALS — BP 157/96 | HR 104 | Temp 97.1°F | Ht 72.0 in | Wt 277.2 lb

## 2019-07-29 DIAGNOSIS — D539 Nutritional anemia, unspecified: Secondary | ICD-10-CM | POA: Diagnosis not present

## 2019-07-29 DIAGNOSIS — K625 Hemorrhage of anus and rectum: Secondary | ICD-10-CM

## 2019-07-29 MED ORDER — PEG 3350-KCL-NA BICARB-NACL 420 G PO SOLR: 4000.0000 mL | Freq: Once | ORAL | 0 refills | Status: AC

## 2019-07-29 NOTE — Progress Notes (Signed)
Primary Care Physician: Etter Sjogren, FNP  Primary Gastroenterologist:  Garfield Cornea, MD   Chief Complaint  Patient presents with  . Rectal Bleeding    HPI: Julian Parsons is a 58 y.o. male here for further evaluation of GI bleeding.  He was last seen in 2017.  Colonoscopy February 2017, 2 tubular adenomas removed, next colonoscopy in 5 years.  Family history colon cancer, father, passed away in his 79s.  Patient states he has a history of hemorrhoids.  He has had intermittent bright red blood per rectum.  Feels confident from his hemorrhoids.  Denies any abdominal pain.  Denies constipation or diarrhea.  No black stools.  Heartburn well controlled pantoprazole 40 mg twice daily.  No dysphagia. No regular ASA/NSAIDS.  Labs indicate macrocytosis.  Recent hemoglobin 11.6.  He consumes 6-8 beers daily.  PCP suggested he start oral iron.  Current Outpatient Medications  Medication Sig Dispense Refill  . escitalopram (LEXAPRO) 20 MG tablet Take 20 mg by mouth daily.      . fish oil-omega-3 fatty acids 1000 MG capsule Take 2 g by mouth daily. Reported on 12/18/2015    . indomethacin (INDOCIN SR) 75 MG CR capsule Take 1 capsule by mouth as needed.    . indomethacin (INDOCIN) 50 MG capsule Take 1 capsule by mouth as needed.    Marland Kitchen olmesartan (BENICAR) 40 MG tablet Take 40 mg by mouth daily.      . pantoprazole (PROTONIX) 40 MG tablet Take 40 mg by mouth 2 (two) times daily.      . rosuvastatin (CRESTOR) 5 MG tablet Take 5 mg by mouth daily.  6   No current facility-administered medications for this visit.     Allergies as of 07/29/2019  . (No Known Allergies)   Past Medical History:  Diagnosis Date  . Anxiety   . GERD (gastroesophageal reflux disease)   . Hyperlipemia   . Hypertension    Past Surgical History:  Procedure Laterality Date  . COLONOSCOPY WITH PROPOFOL N/A 01/11/2016   Dr. Gala Romney: two tubular adenomas removed. next tcs in 5 years  . HIP SURGERY     right  .  LEG SURGERY     Right    Family History  Problem Relation Age of Onset  . Colon cancer Father        Unknown age of onset; Passed away in late 69s  . Diabetes Mother    Social History   Tobacco Use  . Smoking status: Never Smoker  . Smokeless tobacco: Never Used  Substance Use Topics  . Alcohol use: Yes    Alcohol/week: 0.0 standard drinks    Comment: 6-8 beer daily   . Drug use: No      ROS:  General: Negative for anorexia, weight loss, fever, chills, fatigue, weakness. ENT: Negative for hoarseness, difficulty swallowing , nasal congestion. CV: Negative for chest pain, angina, palpitations, dyspnea on exertion, peripheral edema.  Respiratory: Negative for dyspnea at rest, dyspnea on exertion, cough, sputum, wheezing.  GI: See history of present illness. GU:  Negative for dysuria, hematuria, urinary incontinence, urinary frequency, nocturnal urination.  Endo: Negative for unusual weight change.    Physical Examination:   BP (!) 157/96   Pulse (!) 104   Temp (!) 97.1 F (36.2 C) (Temporal)   Ht 6' (1.829 m)   Wt 277 lb 3.2 oz (125.7 kg)   BMI 37.60 kg/m   General: Well-nourished, well-developed in no acute distress.  Eyes:  No icterus. Mouth: Oropharyngeal mucosa moist and pink , no lesions erythema or exudate. Lungs: Clear to auscultation bilaterally.  Heart: Regular rate and rhythm, no murmurs rubs or gallops.  Abdomen: Bowel sounds are normal, nontender, nondistended, no hepatosplenomegaly or masses, no abdominal bruits or hernia , no rebound or guarding.   Extremities: No lower extremity edema. No clubbing or deformities. Neuro: Alert and oriented x 4   Skin: Warm and dry, no jaundice.   Psych: Alert and cooperative, normal mood and affect.  Labs:  Lab Results  Component Value Date   WBC 7.9 07/01/2019   HGB 13.3 07/01/2019   HCT 40.5 07/01/2019   MCV 104.1 (H) 07/01/2019   PLT 214 07/01/2019     07/23/2019: White blood cell count 8600, hemoglobin  11.6 low, hematocrit 36.3, MCV 111, platelets 250,000  Imaging Studies: Dg Chest 2 View  Result Date: 07/01/2019 CLINICAL DATA:  Palpitations, tachycardia, shortness of breath, hypertension EXAM: CHEST - 2 VIEW COMPARISON:  None FINDINGS: Minimal enlargement of cardiac silhouette. Mediastinal contours and pulmonary vascularity normal. Atelectasis at LEFT base. Remaining lungs clear. No pleural effusion or pneumothorax. Scattered endplate spur formation thoracic spine. IMPRESSION: Enlargement of cardiac silhouette with LEFT basilar atelectasis. Electronically Signed   By: Lavonia Dana M.D.   On: 07/01/2019 13:40

## 2019-07-29 NOTE — Telephone Encounter (Signed)
Called pt, NA and unable to leave VM. Letter mailed with pre-op and covid-19 appt

## 2019-07-29 NOTE — Patient Instructions (Signed)
1. Colonoscopy to be scheduled.  2. Make sure you hold iron 7 days before your procedure.  3. Go this week for labs to check iron, folate, B12 and follow up on your anemia.

## 2019-08-01 ENCOUNTER — Encounter: Payer: Self-pay | Admitting: Gastroenterology

## 2019-08-01 NOTE — Assessment & Plan Note (Addendum)
Pleasant 58 year old gentleman with family history of colon cancer, personal history of adenomatous colon polyps (February 2017) who presents with mild microcytic anemia and rectal bleeding.  Rectal bleeding may be due to benign anorectal source however it appears that he has had a good drop in his hemoglobin from 13.3-11.6 over the last 4 to 6 weeks.  He drinks a significant amount of alcohol daily, likely explains elevated MCV.  Will obtain further labs.  Plan for colonoscopy with propofol in the near future.  I have discussed the risks, alternatives, benefits with regards to but not limited to the risk of reaction to medication, bleeding, infection, perforation and the patient is agreeable to proceed. Written consent to be obtained.   Encouraged decreased alcohol consumption. Hold iron 7 days prior to procedure.

## 2019-08-01 NOTE — Assessment & Plan Note (Addendum)
Obtain anemia labs.

## 2019-08-02 ENCOUNTER — Telehealth: Payer: Self-pay | Admitting: *Deleted

## 2019-08-02 NOTE — Telephone Encounter (Signed)
PA for colonoscopy approved via [facility] website. Auth# [id number] Dates 09/19/2019-12/18/2019

## 2019-08-23 ENCOUNTER — Telehealth: Payer: Self-pay | Admitting: Gastroenterology

## 2019-08-23 NOTE — Telephone Encounter (Signed)
Stable labs done in [place].  Dated July 29, 2019.  White blood cell count 6900, hemoglobin low at 12.12, hematocrit low at 39, MCV elevated at 111, platelets 266,000, iron 118, TIBC 294, iron saturations 40%, ferritin 357, folate 9.2, B12 312.  Suspect macrocytosis related to alcohol use.  Recommend cutting back on alcohol use.  Colonoscopy as planned.

## 2019-08-26 NOTE — Telephone Encounter (Signed)
Pt notified that [facility] reviewed his labs and is aware of the recommendations of cutting back on alcohol use and will plan for his TCS.

## 2019-09-13 NOTE — Patient Instructions (Signed)
Julian Parsons  09/13/2019     @PREFPERIOPPHARMACY @   Your procedure is scheduled on  09/19/2019 .  Report to [facility] at  1330 (1:30)  P.M.  Call this number if you have problems the morning of surgery:  [phone]   Remember:  Follow the diet and prep instructions given to you by Dr Roseanne Kaufman office.                     Take these medicines the morning of surgery with A SIP OF WATER  Lexapro, indomethicin, protonix.    Do not wear jewelry, make-up or nail polish.  Do not wear lotions, powders, or perfumes, or deodorant.  Do not shave 48 hours prior to surgery.  Men may shave face and neck.  Do not bring valuables to the hospital.  [facility] is not responsible for any belongings or valuables.  Contacts, dentures or bridgework may not be worn into surgery.  Leave your suitcase in the car.  After surgery it may be brought to your room.  For patients admitted to the hospital, discharge time will be determined by your treatment team.  Patients discharged the day of surgery will not be allowed to drive home.   Name and phone number of your driver:   family Special instructions:  family  Please read over the following fact sheets that you were given. Anesthesia Post-op Instructions and Care and Recovery After Surgery       Colonoscopy, Adult, Care After This sheet gives you information about how to care for yourself after your procedure. Your health care provider may also give you more specific instructions. If you have problems or questions, contact your health care provider. What can I expect after the procedure? After the procedure, it is common to have:  A small amount of blood in your stool for 24 hours after the procedure.  Some gas.  Mild abdominal cramping or bloating. Follow these instructions at home: General instructions  For the first 24 hours after the procedure: ? Do not drive or use machinery. ? Do not sign important documents. ? Do not  drink alcohol. ? Do your regular daily activities at a slower pace than normal. ? Eat soft, easy-to-digest foods.  Take over-the-counter or prescription medicines only as told by your health care provider. Relieving cramping and bloating   Try walking around when you have cramps or feel bloated.  Apply heat to your abdomen as told by your health care provider. Use a heat source that your health care provider recommends, such as a moist heat pack or a heating pad. ? Place a towel between your skin and the heat source. ? Leave the heat on for 20-30 minutes. ? Remove the heat if your skin turns bright red. This is especially important if you are unable to feel pain, heat, or cold. You may have a greater risk of getting burned. Eating and drinking   Drink enough fluid to keep your urine pale yellow.  Resume your normal diet as instructed by your health care provider. Avoid heavy or fried foods that are hard to digest.  Avoid drinking alcohol for as long as instructed by your health care provider. Contact a health care provider if:  You have blood in your stool 2-3 days after the procedure. Get help right away if:  You have more than a small spotting of blood in your stool.  You pass large blood clots in your  stool.  Your abdomen is swollen.  You have nausea or vomiting.  You have a fever.  You have increasing abdominal pain that is not relieved with medicine. Summary  After the procedure, it is common to have a small amount of blood in your stool. You may also have mild abdominal cramping and bloating.  For the first 24 hours after the procedure, do not drive or use machinery, sign important documents, or drink alcohol.  Contact your health care provider if you have a lot of blood in your stool, nausea or vomiting, a fever, or increased abdominal pain. This information is not intended to replace advice given to you by your health care provider. Make sure you discuss any  questions you have with your health care provider. Document Released: 07/05/2004 Document Revised: 09/13/2017 Document Reviewed: 02/02/2016 Elsevier Patient Education  2020 [place] After These instructions provide you with information about caring for yourself after your procedure. Your health care provider may also give you more specific instructions. Your treatment has been planned according to current medical practices, but problems sometimes occur. Call your health care provider if you have any problems or questions after your procedure. What can I expect after the procedure? After your procedure, you may:  Feel sleepy for several hours.  Feel clumsy and have poor balance for several hours.  Feel forgetful about what happened after the procedure.  Have poor judgment for several hours.  Feel nauseous or vomit.  Have a sore throat if you had a breathing tube during the procedure. Follow these instructions at home: For at least 24 hours after the procedure:      Have a responsible adult stay with you. It is important to have someone help care for you until you are awake and alert.  Rest as needed.  Do not: ? Participate in activities in which you could fall or become injured. ? Drive. ? Use heavy machinery. ? Drink alcohol. ? Take sleeping pills or medicines that cause drowsiness. ? Make important decisions or sign legal documents. ? Take care of children on your own. Eating and drinking  Follow the diet that is recommended by your health care provider.  If you vomit, drink water, juice, or soup when you can drink without vomiting.  Make sure you have little or no nausea before eating solid foods. General instructions  Take over-the-counter and prescription medicines only as told by your health care provider.  If you have sleep apnea, surgery and certain medicines can increase your risk for breathing problems. Follow instructions  from your health care provider about wearing your sleep device: ? Anytime you are sleeping, including during daytime naps. ? While taking prescription pain medicines, sleeping medicines, or medicines that make you drowsy.  If you smoke, do not smoke without supervision.  Keep all follow-up visits as told by your health care provider. This is important. Contact a health care provider if:  You keep feeling nauseous or you keep vomiting.  You feel light-headed.  You develop a rash.  You have a fever. Get help right away if:  You have trouble breathing. Summary  For several hours after your procedure, you may feel sleepy and have poor judgment.  Have a responsible adult stay with you for at least 24 hours or until you are awake and alert. This information is not intended to replace advice given to you by your health care provider. Make sure you discuss any questions you have with your  health care provider. Document Released: 03/13/2016 Document Revised: 02/19/2018 Document Reviewed: 03/13/2016 Elsevier Patient Education  2020 [employer].

## 2019-09-17 ENCOUNTER — Other Ambulatory Visit (HOSPITAL_COMMUNITY)
Admission: RE | Admit: 2019-09-17 | Discharge: 2019-09-17 | Disposition: A | Discharge status: Home / Self Care | Payer: 59 | Source: Ambulatory Visit | Attending: Internal Medicine | Admitting: Internal Medicine

## 2019-09-17 ENCOUNTER — Encounter (HOSPITAL_COMMUNITY)
Admission: RE | Admit: 2019-09-17 | Discharge: 2019-09-17 | Disposition: A | Discharge status: Home / Self Care | Payer: 59 | Source: Ambulatory Visit | Attending: Internal Medicine | Admitting: Internal Medicine

## 2019-09-17 ENCOUNTER — Other Ambulatory Visit: Payer: Self-pay

## 2019-09-17 DIAGNOSIS — K649 Unspecified hemorrhoids: Secondary | ICD-10-CM | POA: Diagnosis not present

## 2019-09-17 DIAGNOSIS — D539 Nutritional anemia, unspecified: Secondary | ICD-10-CM | POA: Diagnosis not present

## 2019-09-17 DIAGNOSIS — Z20828 Contact with and (suspected) exposure to other viral communicable diseases: Secondary | ICD-10-CM | POA: Diagnosis not present

## 2019-09-17 DIAGNOSIS — Z01812 Encounter for preprocedural laboratory examination: Secondary | ICD-10-CM | POA: Diagnosis present

## 2019-09-17 DIAGNOSIS — K621 Rectal polyp: Secondary | ICD-10-CM | POA: Insufficient documentation

## 2019-09-17 LAB — BASIC METABOLIC PANEL
Anion gap: 10 (ref 5–15)
BUN: 21 mg/dL — ABNORMAL HIGH (ref 6–20)
CO2: 26 mmol/L (ref 22–32)
Calcium: 9.4 mg/dL (ref 8.9–10.3)
Chloride: 102 mmol/L (ref 98–111)
Creatinine, Ser: 0.95 mg/dL (ref 0.61–1.24)
GFR calc Af Amer: >60 mL/min (ref >60)
GFR calc non Af Amer: >60 mL/min (ref >60)
Glucose, Bld: 102 mg/dL — ABNORMAL HIGH (ref 70–99)
Potassium: 4.9 mmol/L (ref 3.5–5.1)
Sodium: 138 mmol/L (ref 135–145)

## 2019-09-17 LAB — SARS CORONAVIRUS 2 (TAT 6-24 HRS): SARS Coronavirus 2: NEGATIVE

## 2019-09-19 ENCOUNTER — Encounter (HOSPITAL_COMMUNITY)
Admission: RE | Disposition: A | Discharge status: Home / Self Care | Payer: Self-pay | Source: Home / Self Care | Attending: Internal Medicine

## 2019-09-19 ENCOUNTER — Ambulatory Visit (HOSPITAL_COMMUNITY): Payer: 59 | Admitting: Anesthesiology

## 2019-09-19 ENCOUNTER — Ambulatory Visit (HOSPITAL_COMMUNITY)
Admission: RE | Admit: 2019-09-19 | Discharge: 2019-09-19 | Disposition: A | Discharge status: Home / Self Care | Payer: 59 | Attending: Internal Medicine | Admitting: Internal Medicine

## 2019-09-19 DIAGNOSIS — K621 Rectal polyp: Secondary | ICD-10-CM

## 2019-09-19 DIAGNOSIS — D649 Anemia, unspecified: Secondary | ICD-10-CM | POA: Diagnosis not present

## 2019-09-19 DIAGNOSIS — K219 Gastro-esophageal reflux disease without esophagitis: Secondary | ICD-10-CM | POA: Diagnosis not present

## 2019-09-19 DIAGNOSIS — D128 Benign neoplasm of rectum: Secondary | ICD-10-CM | POA: Insufficient documentation

## 2019-09-19 DIAGNOSIS — K625 Hemorrhage of anus and rectum: Secondary | ICD-10-CM

## 2019-09-19 DIAGNOSIS — Z800 Family history of malignant neoplasm of digestive organs: Secondary | ICD-10-CM | POA: Insufficient documentation

## 2019-09-19 DIAGNOSIS — D539 Nutritional anemia, unspecified: Secondary | ICD-10-CM

## 2019-09-19 DIAGNOSIS — E785 Hyperlipidemia, unspecified: Secondary | ICD-10-CM | POA: Diagnosis not present

## 2019-09-19 DIAGNOSIS — Z79899 Other long term (current) drug therapy: Secondary | ICD-10-CM | POA: Diagnosis not present

## 2019-09-19 DIAGNOSIS — F419 Anxiety disorder, unspecified: Secondary | ICD-10-CM | POA: Insufficient documentation

## 2019-09-19 DIAGNOSIS — K921 Melena: Secondary | ICD-10-CM | POA: Insufficient documentation

## 2019-09-19 DIAGNOSIS — I10 Essential (primary) hypertension: Secondary | ICD-10-CM | POA: Insufficient documentation

## 2019-09-19 DIAGNOSIS — Z86010 Personal history of colonic polyps: Secondary | ICD-10-CM | POA: Diagnosis not present

## 2019-09-19 HISTORY — PX: POLYPECTOMY: SHX5525

## 2019-09-19 HISTORY — PX: COLONOSCOPY WITH PROPOFOL: SHX5780

## 2019-09-19 LAB — GLUCOSE, CAPILLARY: Glucose-Capillary: 94 mg/dL (ref 70–99)

## 2019-09-19 SURGERY — COLONOSCOPY WITH PROPOFOL
Anesthesia: General

## 2019-09-19 MED ORDER — PROMETHAZINE HCL 25 MG/ML IJ SOLN: 6.2500 mg | INTRAMUSCULAR | Status: DC | PRN

## 2019-09-19 MED ORDER — HYDROCODONE-ACETAMINOPHEN 7.5-325 MG PO TABS: 1.0000 | ORAL_TABLET | Freq: Once | ORAL | Status: DC | PRN

## 2019-09-19 MED ORDER — KETAMINE HCL 50 MG/5ML IJ SOSY
PREFILLED_SYRINGE | INTRAMUSCULAR | Status: AC
  Filled 2019-09-19: qty 5

## 2019-09-19 MED ORDER — GLYCOPYRROLATE 0.2 MG/ML IJ SOLN
INTRAMUSCULAR | Status: DC | PRN
  Administered 2019-09-19: 0.2 mg via INTRAVENOUS

## 2019-09-19 MED ORDER — LACTATED RINGERS IV SOLN
INTRAVENOUS | Status: DC
  Administered 2019-09-19: 14:00:00 via INTRAVENOUS

## 2019-09-19 MED ORDER — HYDROMORPHONE HCL 1 MG/ML IJ SOLN: 0.2500 mg | INTRAMUSCULAR | Status: DC | PRN

## 2019-09-19 MED ORDER — PROPOFOL 10 MG/ML IV BOLUS
INTRAVENOUS | Status: AC
  Filled 2019-09-19: qty 40

## 2019-09-19 MED ORDER — PROPOFOL 500 MG/50ML IV EMUL
INTRAVENOUS | Status: DC | PRN
  Administered 2019-09-19: 150 ug/kg/min via INTRAVENOUS
  Administered 2019-09-19: 15:00:00 via INTRAVENOUS

## 2019-09-19 MED ORDER — PROPOFOL 10 MG/ML IV BOLUS
INTRAVENOUS | Status: DC | PRN
  Administered 2019-09-19: 20 mg via INTRAVENOUS

## 2019-09-19 MED ORDER — CHLORHEXIDINE GLUCONATE CLOTH 2 % EX PADS: 6.0000 | MEDICATED_PAD | Freq: Once | CUTANEOUS | Status: DC

## 2019-09-19 MED ORDER — LIDOCAINE HCL (CARDIAC) PF 100 MG/5ML IV SOSY
PREFILLED_SYRINGE | INTRAVENOUS | Status: DC | PRN
  Administered 2019-09-19: 60 mg via INTRAVENOUS

## 2019-09-19 MED ORDER — KETAMINE HCL 10 MG/ML IJ SOLN
INTRAMUSCULAR | Status: DC | PRN
  Administered 2019-09-19: 10 mg via INTRAVENOUS

## 2019-09-19 MED ORDER — MIDAZOLAM HCL 2 MG/2ML IJ SOLN: 0.5000 mg | Freq: Once | INTRAMUSCULAR | Status: DC | PRN

## 2019-09-19 NOTE — Discharge Instructions (Signed)
Colonoscopy Discharge Instructions  Read the instructions outlined below and refer to this sheet in the next few weeks. These discharge instructions provide you with general information on caring for yourself after you leave the hospital. Your doctor may also give you specific instructions. While your treatment has been planned according to the most current medical practices available, unavoidable complications occasionally occur. If you have any problems or questions after discharge, call Dr. Gala Romney at [phone]. ACTIVITY  You may resume your regular activity, but move at a slower pace for the next 24 hours.   Take frequent rest periods for the next 24 hours.   Walking will help get rid of the air and reduce the bloated feeling in your belly (abdomen).   No driving for 24 hours (because of the medicine (anesthesia) used during the test).    Do not sign any important legal documents or operate any machinery for 24 hours (because of the anesthesia used during the test).  NUTRITION  Drink plenty of fluids.   You may resume your normal diet as instructed by your doctor.   Begin with a light meal and progress to your normal diet. Heavy or fried foods are harder to digest and may make you feel sick to your stomach (nauseated).   Avoid alcoholic beverages for 24 hours or as instructed.  MEDICATIONS  You may resume your normal medications unless your doctor tells you otherwise.  WHAT YOU CAN EXPECT TODAY  Some feelings of bloating in the abdomen.   Passage of more gas than usual.   Spotting of blood in your stool or on the toilet paper.  IF YOU HAD POLYPS REMOVED DURING THE COLONOSCOPY:  No aspirin products for 7 days or as instructed.   No alcohol for 7 days or as instructed.   Eat a soft diet for the next 24 hours.  FINDING OUT THE RESULTS OF YOUR TEST Not all test results are available during your visit. If your test results are not back during the visit, make an appointment  with your caregiver to find out the results. Do not assume everything is normal if you have not heard from your caregiver or the medical facility. It is important for you to follow up on all of your test results.  SEEK IMMEDIATE MEDICAL ATTENTION IF:  You have more than a spotting of blood in your stool.   Your belly is swollen (abdominal distention).   You are nauseated or vomiting.   You have a temperature over 101.   You have abdominal pain or discomfort that is severe or gets worse throughout the day.   Colon diverticulosis and hemorrhoid information provided  Further recommendations to follow pending review of pathology report  At patient's request I called Jenny Reichmann for, wife, at [phone]; left a message with impression and recommendations on answering machine    Diverticulosis  Diverticulosis is a condition that develops when small pouches (diverticula) form in the wall of the large intestine (colon). The colon is where water is absorbed and stool is formed. The pouches form when the inside layer of the colon pushes through weak spots in the outer layers of the colon. You may have a few pouches or many of them. What are the causes? The cause of this condition is not known. What increases the risk? The following factors may make you more likely to develop this condition:  Being older than age 67. Your risk for this condition increases with age. Diverticulosis is rare among people younger  than age 54. By age 20, many people have it.  Eating a low-fiber diet.  Having frequent constipation.  Being overweight.  Not getting enough exercise.  Smoking.  Taking over-the-counter pain medicines, like aspirin and ibuprofen.  Having a family history of diverticulosis. What are the signs or symptoms? In most people, there are no symptoms of this condition. If you do have symptoms, they may include:  Bloating.  Cramps in the abdomen.  Constipation or diarrhea.  Pain in  the lower left side of the abdomen. How is this diagnosed? This condition is most often diagnosed during an exam for other colon problems. Because diverticulosis usually has no symptoms, it often cannot be diagnosed independently. This condition may be diagnosed by:  Using a flexible scope to examine the colon (colonoscopy).  Taking an X-ray of the colon after dye has been put into the colon (barium enema).  Doing a CT scan. How is this treated? You may not need treatment for this condition if you have never developed an infection related to diverticulosis. If you have had an infection before, treatment may include:  Eating a high-fiber diet. This may include eating more fruits, vegetables, and grains.  Taking a fiber supplement.  Taking a live bacteria supplement (probiotic).  Taking medicine to relax your colon.  Taking antibiotic medicines. Follow these instructions at home:  Drink 6-8 glasses of water or more each day to prevent constipation.  Try not to strain when you have a bowel movement.  If you have had an infection before: ? Eat more fiber as directed by your health care provider or your diet and nutrition specialist (dietitian). ? Take a fiber supplement or probiotic, if your health care provider approves.  Take over-the-counter and prescription medicines only as told by your health care provider.  If you were prescribed an antibiotic, take it as told by your health care provider. Do not stop taking the antibiotic even if you start to feel better.  Keep all follow-up visits as told by your health care provider. This is important. Contact a health care provider if:  You have pain in your abdomen.  You have bloating.  You have cramps.  You have not had a bowel movement in 3 days. Get help right away if:  Your pain gets worse.  Your bloating becomes very bad.  You have a fever or chills, and your symptoms suddenly get worse.  You vomit.  You have bowel  movements that are bloody or black.  You have bleeding from your rectum. Summary  Diverticulosis is a condition that develops when small pouches (diverticula) form in the wall of the large intestine (colon).  You may have a few pouches or many of them.  This condition is most often diagnosed during an exam for other colon problems.  If you have had an infection related to diverticulosis, treatment may include increasing the fiber in your diet, taking supplements, or taking medicines. This information is not intended to replace advice given to you by your health care provider. Make sure you discuss any questions you have with your health care provider. Document Released: 08/18/2004 Document Revised: 11/03/2017 Document Reviewed: 10/10/2016 Elsevier Patient Education  2020 [employer].     Hemorrhoids Hemorrhoids are swollen veins in and around the rectum or anus. There are two types of hemorrhoids:  Internal hemorrhoids. These occur in the veins that are just inside the rectum. They may poke through to the outside and become irritated and painful.  External  hemorrhoids. These occur in the veins that are outside the anus and can be felt as a painful swelling or hard lump near the anus. Most hemorrhoids do not cause serious problems, and they can be managed with home treatments such as diet and lifestyle changes. If home treatments do not help the symptoms, procedures can be done to shrink or remove the hemorrhoids. What are the causes? This condition is caused by increased pressure in the anal area. This pressure may result from various things, including:  Constipation.  Straining to have a bowel movement.  Diarrhea.  Pregnancy.  Obesity.  Sitting for long periods of time.  Heavy lifting or other activity that causes you to strain.  Anal sex.  Riding a bike for a long period of time. What are the signs or symptoms? Symptoms of this condition include:  Pain.  Anal  itching or irritation.  Rectal bleeding.  Leakage of stool (feces).  Anal swelling.  One or more lumps around the anus. How is this diagnosed? This condition can often be diagnosed through a visual exam. Other exams or tests may also be done, such as:  An exam that involves feeling the rectal area with a gloved hand (digital rectal exam).  An exam of the anal canal that is done using a small tube (anoscope).  A blood test, if you have lost a significant amount of blood.  A test to look inside the colon using a flexible tube with a camera on the end (sigmoidoscopy or colonoscopy). How is this treated? This condition can usually be treated at home. However, various procedures may be done if dietary changes, lifestyle changes, and other home treatments do not help your symptoms. These procedures can help make the hemorrhoids smaller or remove them completely. Some of these procedures involve surgery, and others do not. Common procedures include:  Rubber band ligation. Rubber bands are placed at the base of the hemorrhoids to cut off their blood supply.  Sclerotherapy. Medicine is injected into the hemorrhoids to shrink them.  Infrared coagulation. A type of light energy is used to get rid of the hemorrhoids.  Hemorrhoidectomy surgery. The hemorrhoids are surgically removed, and the veins that supply them are tied off.  Stapled hemorrhoidopexy surgery. The surgeon staples the base of the hemorrhoid to the rectal wall. Follow these instructions at home: Eating and drinking   Eat foods that have a lot of fiber in them, such as whole grains, beans, nuts, fruits, and vegetables.  Ask your health care provider about taking products that have added fiber (fiber supplements).  Reduce the amount of fat in your diet. You can do this by eating low-fat dairy products, eating less red meat, and avoiding processed foods.  Drink enough fluid to keep your urine pale yellow. Managing pain and  swelling   Take warm sitz baths for 20 minutes, 3-4 times a day to ease pain and discomfort. You may do this in a bathtub or using a portable sitz bath that fits over the toilet.  If directed, apply ice to the affected area. Using ice packs between sitz baths may be helpful. ? Put ice in a plastic bag. ? Place a towel between your skin and the bag. ? Leave the ice on for 20 minutes, 2-3 times a day. General instructions  Take over-the-counter and prescription medicines only as told by your health care provider.  Use medicated creams or suppositories as told.  Get regular exercise. Ask your health care provider how much  and what kind of exercise is best for you. In general, you should do moderate exercise for at least 30 minutes on most days of the week (150 minutes each week). This can include activities such as walking, biking, or yoga.  Go to the bathroom when you have the urge to have a bowel movement. Do not wait.  Avoid straining to have bowel movements.  Keep the anal area dry and clean. Use wet toilet paper or moist towelettes after a bowel movement.  Do not sit on the toilet for long periods of time. This increases blood pooling and pain.  Keep all follow-up visits as told by your health care provider. This is important. Contact a health care provider if you have:  Increasing pain and swelling that are not controlled by treatment or medicine.  Difficulty having a bowel movement, or you are unable to have a bowel movement.  Pain or inflammation outside the area of the hemorrhoids. Get help right away if you have:  Uncontrolled bleeding from your rectum. Summary  Hemorrhoids are swollen veins in and around the rectum or anus.  Most hemorrhoids can be managed with home treatments such as diet and lifestyle changes.  Taking warm sitz baths can help ease pain and discomfort.  In severe cases, procedures or surgery can be done to shrink or remove the hemorrhoids. This  information is not intended to replace advice given to you by your health care provider. Make sure you discuss any questions you have with your health care provider. Document Released: 11/18/2000 Document Revised: 11/29/2018 Document Reviewed: 04/12/2018 Elsevier Patient Education  2020 [place] After These instructions provide you with information about caring for yourself after your procedure. Your health care provider may also give you more specific instructions. Your treatment has been planned according to current medical practices, but problems sometimes occur. Call your health care provider if you have any problems or questions after your procedure. What can I expect after the procedure? After your procedure, you may:  Feel sleepy for several hours.  Feel clumsy and have poor balance for several hours.  Feel forgetful about what happened after the procedure.  Have poor judgment for several hours.  Feel nauseous or vomit.  Have a sore throat if you had a breathing tube during the procedure. Follow these instructions at home: For at least 24 hours after the procedure:      Have a responsible adult stay with you. It is important to have someone help care for you until you are awake and alert.  Rest as needed.  Do not: ? Participate in activities in which you could fall or become injured. ? Drive. ? Use heavy machinery. ? Drink alcohol. ? Take sleeping pills or medicines that cause drowsiness. ? Make important decisions or sign legal documents. ? Take care of children on your own. Eating and drinking  Follow the diet that is recommended by your health care provider.  If you vomit, drink water, juice, or soup when you can drink without vomiting.  Make sure you have little or no nausea before eating solid foods. General instructions  Take over-the-counter and prescription medicines only as told by your health care  provider.  If you have sleep apnea, surgery and certain medicines can increase your risk for breathing problems. Follow instructions from your health care provider about wearing your sleep device: ? Anytime you are sleeping, including during daytime naps. ? While taking prescription pain medicines,  sleeping medicines, or medicines that make you drowsy.  If you smoke, do not smoke without supervision.  Keep all follow-up visits as told by your health care provider. This is important. Contact a health care provider if:  You keep feeling nauseous or you keep vomiting.  You feel light-headed.  You develop a rash.  You have a fever. Get help right away if:  You have trouble breathing. Summary  For several hours after your procedure, you may feel sleepy and have poor judgment.  Have a responsible adult stay with you for at least 24 hours or until you are awake and alert. This information is not intended to replace advice given to you by your health care provider. Make sure you discuss any questions you have with your health care provider. Document Released: 03/13/2016 Document Revised: 02/19/2018 Document Reviewed: 03/13/2016 Elsevier Patient Education  2020 [employer].

## 2019-09-19 NOTE — Op Note (Signed)
[facility] Patient Name: Julian Parsons Procedure Date: 09/19/2019 2:33 PM MRN: [id number] Date of Birth: 1961/03/24 Attending MD: Norvel Richards , MD CSN: [id number] Age: 58 Admit Type: Outpatient Procedure:                Colonoscopy Indications:              Hematochezia Providers:                Norvel Richards, MD, Janeece Riggers, RN, Starla Link RN, RN, Nelma Rothman, Technician Referring MD:              Medicines:                Propofol per Anesthesia Complications:            No immediate complications. Estimated Blood Loss:     Estimated blood loss was minimal. Estimated blood                            loss was minimal. Procedure:                Pre-Anesthesia Assessment:                           - Prior to the procedure, a History and Physical                            was performed, and patient medications and                            allergies were reviewed. The patient's tolerance of                            previous anesthesia was also reviewed. The risks                            and benefits of the procedure and the sedation                            options and risks were discussed with the patient.                            All questions were answered, and informed consent                            was obtained. Prior Anticoagulants: The patient has                            taken no previous anticoagulant or antiplatelet                            agents. ASA Grade Assessment: II - A patient with  mild systemic disease. After reviewing the risks                            and benefits, the patient was deemed in                            satisfactory condition to undergo the procedure.                           After obtaining informed consent, the colonoscope                            was passed under direct vision. Throughout the                            procedure, the patient's  blood pressure, pulse, and                            oxygen saturations were monitored continuously. The                            [id number]) scope was introduced through                            the anus and advanced to the 5 cm into the ileum.                            The colonoscopy was performed without difficulty.                            The patient tolerated the procedure well. The                            quality of the bowel preparation was adequate. Scope In: 2:46:31 PM Scope Out: 2:58:42 PM Scope Withdrawal Time: 0 hours 9 minutes 57 seconds  Total Procedure Duration: 0 hours 12 minutes 11 seconds  Findings:      The perianal and digital rectal examinations were normal.      A 4 mm polyp was found in the rectum. The polyp was sessile. The polyp       was removed with a cold snare. Resection and retrieval were complete.       Estimated blood loss was minimal.      The exam was otherwise without abnormality on direct and retroflexion       views. Impression:               - One 4 mm polyp in the rectum, removed with a cold                            snare. Resected and retrieved.                           - The examination was otherwise normal on direct  and retroflexion views. Moderate Sedation:      Moderate (conscious) sedation was personally administered by an       anesthesia professional. The following parameters were monitored: oxygen       saturation, heart rate, blood pressure, respiratory rate, EKG, adequacy       of pulmonary ventilation, and response to care. Recommendation:           - Patient has a contact number available for                            emergencies. The signs and symptoms of potential                            delayed complications were discussed with the                            patient. Return to normal activities tomorrow.                            Written discharge instructions were provided to  the                            patient.                           - Resume previous diet.                           - Continue present medications.                           - Repeat colonoscopy date to be determined after                            pending pathology results are reviewed for                            surveillance.                           - Return to GI office (date not yet determined). Procedure Code(s):        --- Professional ---                           [phone], Colonoscopy, flexible; with removal of                            tumor(s), polyp(s), or other lesion(s) by snare                            technique Diagnosis Code(s):        --- Professional ---                           K62.1, Rectal polyp  K92.1, Melena (includes Hematochezia) CPT copyright 2019 American Medical Association. All rights reserved. The codes documented in this report are preliminary and upon coder review may  be revised to meet current compliance requirements. Cristopher Estimable. Kijana Cromie, MD Norvel Richards, MD 09/19/2019 3:06:43 PM This report has been signed electronically. Number of Addenda: 0

## 2019-09-19 NOTE — Anesthesia Procedure Notes (Addendum)
Procedure Name: General with mask airway Date/Time: 09/19/2019 2:39 PM Performed by: Andree Elk Amy A, CRNA Pre-anesthesia Checklist: Patient identified, Emergency Drugs available, Suction available, Timeout performed and Patient being monitored Patient Re-evaluated:Patient Re-evaluated prior to induction Oxygen Delivery Method: Non-rebreather mask

## 2019-09-19 NOTE — Anesthesia Postprocedure Evaluation (Signed)
Anesthesia Post Note  Patient: Julian Parsons  Procedure(s) Performed: COLONOSCOPY WITH PROPOFOL (N/A ) POLYPECTOMY  Anesthesia Post Evaluation   Last Vitals:  Vitals:   09/19/19 1359 09/19/19 1505  BP: (!) 141/94   Pulse: (!) 102   Resp: 16   Temp: 37 C (P) 36.6 C  SpO2: 95%     Last Pain:  Vitals:   09/19/19 1442  TempSrc:   PainSc: 0-No pain                 Pamelyn Bancroft A

## 2019-09-19 NOTE — H&P (Signed)
@LOGO @   Primary Care Physician:  Etter Sjogren, FNP Primary Gastroenterologist:  Dr. Gala Romney  Pre-Procedure History & Physical: HPI:  Julian Parsons is a 57 y.o. male here for diagnostic colonoscopy.  Intermittent bright red per blood per rectum.  History of colonic adenoma removed almost 4 years ago.  States he has had no rectal bleeding in the past month.  Past Medical History:  Diagnosis Date  . Anxiety   . GERD (gastroesophageal reflux disease)   . Hyperlipemia   . Hypertension     Past Surgical History:  Procedure Laterality Date  . COLONOSCOPY WITH PROPOFOL N/A 01/11/2016   Dr. Gala Romney: two tubular adenomas removed. next tcs in 5 years  . HIP SURGERY     right  . LEG SURGERY     Right     Prior to Admission medications   Medication Sig Start Date End Date Taking? Authorizing Provider  escitalopram (LEXAPRO) 20 MG tablet Take 20 mg by mouth daily.     Yes [provider]  hydrochlorothiazide (HYDRODIURIL) 12.5 MG tablet Take 12.5 mg by mouth daily.   Yes [provider]  indomethacin (INDOCIN SR) 75 MG CR capsule Take 1 capsule by mouth daily as needed for moderate pain.  07/23/19  Yes [provider]  indomethacin (INDOCIN) 50 MG capsule Take 50 mg by mouth daily as needed for moderate pain.  07/22/19  Yes [provider]  IRON-VITAMIN C PO Take 1 tablet by mouth daily.   Yes [provider]  olmesartan (BENICAR) 40 MG tablet Take 40 mg by mouth daily.     Yes [provider]  Omega 3 1200 MG CAPS Take 1,200 mg by mouth daily.   Yes [provider]  pantoprazole (PROTONIX) 40 MG tablet Take 40 mg by mouth daily.    Yes [provider]  rosuvastatin (CRESTOR) 5 MG tablet Take 5 mg by mouth daily. 12/03/15  Yes [provider]    Allergies as of 07/29/2019  . (No Known Allergies)    Family History  Problem Relation Age of Onset  . Colon cancer Father        Unknown age of onset; Passed away  in late 4s  . Diabetes Mother     Social History   Socioeconomic History  . Marital status: Married    Spouse name: Not on file  . Number of children: 0  . Years of education: Not on file  . Highest education level: Not on file  Occupational History  . Occupation: Chief Strategy Officer    Comment: Self Employed  Social Needs  . Financial resource strain: Not on file  . Food insecurity    Worry: Not on file    Inability: Not on file  . Transportation needs    Medical: Not on file    Non-medical: Not on file  Tobacco Use  . Smoking status: Never Smoker  . Smokeless tobacco: Never Used  Substance and Sexual Activity  . Alcohol use: Yes    Alcohol/week: 0.0 standard drinks    Comment: 6-8 beer daily   . Drug use: No  . Sexual activity: Not on file  Lifestyle  . Physical activity    Days per week: Not on file    Minutes per session: Not on file  . Stress: Not on file  Relationships  . Social Herbalist on phone: Not on file    Gets together: Not on file    Attends religious service:  Not on file    Active member of club or organization: Not on file    Attends meetings of clubs or organizations: Not on file    Relationship status: Not on file  . Intimate partner violence    Fear of current or ex partner: Not on file    Emotionally abused: Not on file    Physically abused: Not on file    Forced sexual activity: Not on file  Other Topics Concern  . Not on file  Social History Narrative   3 caffeine drinks daily     Review of Systems: See HPI, otherwise negative ROS  Physical Exam: BP (!) 141/94   Pulse (!) 102   Temp 98.6 F (37 C) (Oral)   Resp 16   SpO2 95%  General:   Alert,  Well-developed, well-nourished, pleasant and cooperative in NAD Neck:  Supple; no masses or thyromegaly. No significant cervical adenopathy. Lungs:  Clear throughout to auscultation.   No wheezes, crackles, or rhonchi. No acute distress. Heart:  Regular rate and rhythm; no murmurs,  clicks, rubs,  or gallops. Abdomen: Non-distended, normal bowel sounds.  Soft and nontender without appreciable mass or hepatosplenomegaly.  Pulses:  Normal pulses noted. Extremities:  Without clubbing or edema.  Impression/Plan: 58 year old gentleman with intermittent blood per rectum.  History colonic adenoma.  Positive family history of colon cancer in first-degree relative at a young age.  Progressing anemia noted.  Recommendations: I have offered the patient a diagnostic colonoscopy today per plan.  The risks, benefits, limitations, alternatives and imponderables have been reviewed with the patient. Questions have been answered. All parties are agreeable.     Notice: This dictation was prepared with Dragon dictation along with smaller phrase technology. Any transcriptional errors that result from this process are unintentional and may not be corrected upon review.

## 2019-09-19 NOTE — Transfer of Care (Signed)
Immediate Anesthesia Transfer of Care Note  Patient: Sadik Hassett  Procedure(s) Performed: COLONOSCOPY WITH PROPOFOL (N/A ) POLYPECTOMY  Patient Location: PACU  Anesthesia Type:General  Level of Consciousness: awake, alert , oriented and patient cooperative  Airway & Oxygen Therapy: Patient Spontanous Breathing  Post-op Assessment: Report given to RN and Post -op Vital signs reviewed and stable  Post vital signs: Reviewed and stable  Last Vitals:  Vitals Value Taken Time  BP 121/78 09/19/19 1505  Temp    Pulse 106 09/19/19 1506  Resp 20 09/19/19 1506  SpO2 95 % 09/19/19 1506  Vitals shown include unvalidated device data.  Last Pain:  Vitals:   09/19/19 1442  TempSrc:   PainSc: 0-No pain      Patients Stated Pain Goal: 5 ([id number])  Complications: No apparent anesthesia complications

## 2019-09-19 NOTE — Anesthesia Preprocedure Evaluation (Signed)
Anesthesia Evaluation  Patient identified by MRN, date of birth, ID band Patient awake    Reviewed: Allergy & Precautions, NPO status , Patient's Chart, lab work & pertinent test results  Airway Mallampati: II  TM Distance: >3 FB Neck ROM: Full    Dental no notable dental hx. (+) Teeth Intact   Pulmonary neg pulmonary ROS,    Pulmonary exam normal breath sounds clear to auscultation       Cardiovascular Exercise Tolerance: Good hypertension, Pt. on medications negative cardio ROS Normal cardiovascular examI Rhythm:Regular Rate:Normal  ET limited by musculoskeletal issues  Denies CP/DOE or MI Denies any cardiac interventions    Neuro/Psych Anxiety negative neurological ROS  negative psych ROS   GI/Hepatic Neg liver ROS, GERD  Medicated and Controlled,  Endo/Other  negative endocrine ROS  Renal/GU negative Renal ROS  negative genitourinary   Musculoskeletal negative musculoskeletal ROS (+)   Abdominal   Peds negative pediatric ROS (+)  Hematology negative hematology ROS (+) anemia ,   Anesthesia Other Findings   Reproductive/Obstetrics negative OB ROS                             Anesthesia Physical Anesthesia Plan  ASA: II  Anesthesia Plan: General   Post-op Pain Management:    Induction: Intravenous  PONV Risk Score and Plan: 2 and TIVA, Propofol infusion and Treatment may vary due to age or medical condition  Airway Management Planned: Nasal Cannula and Simple Face Mask  Additional Equipment:   Intra-op Plan:   Post-operative Plan:   Informed Consent: I have reviewed the patients History and Physical, chart, labs and discussed the procedure including the risks, benefits and alternatives for the proposed anesthesia with the patient or authorized representative who has indicated his/her understanding and acceptance.     Dental advisory given  Plan Discussed with:  CRNA  Anesthesia Plan Comments: (Plan Full PPE use  Plan GA with GETA as needed d/w pt -WTP with same after Q&A)        Anesthesia Quick Evaluation

## 2019-09-20 NOTE — Addendum Note (Signed)
Addendum  created 09/20/19 1327 by Jonna Munro, CRNA   Charge Capture section accepted

## 2019-09-23 ENCOUNTER — Encounter: Payer: Self-pay | Admitting: Internal Medicine

## 2019-09-23 LAB — SURGICAL PATHOLOGY

## 2019-09-23 NOTE — Addendum Note (Signed)
Addendum  created 09/23/19 [id number] by Mickel Baas, CRNA   Charge Capture section accepted

## 2019-09-26 ENCOUNTER — Encounter (HOSPITAL_COMMUNITY): Payer: Self-pay | Admitting: Internal Medicine

## 2021-02-16 IMAGING — DX CHEST - 2 VIEW
2 series · 2 of 2 positions shown · non-contrast
Comparison: None

CLINICAL DATA: Palpitations, tachycardia, shortness of breath,
hypertension

EXAM:
CHEST - 2 VIEW

[chest pa]
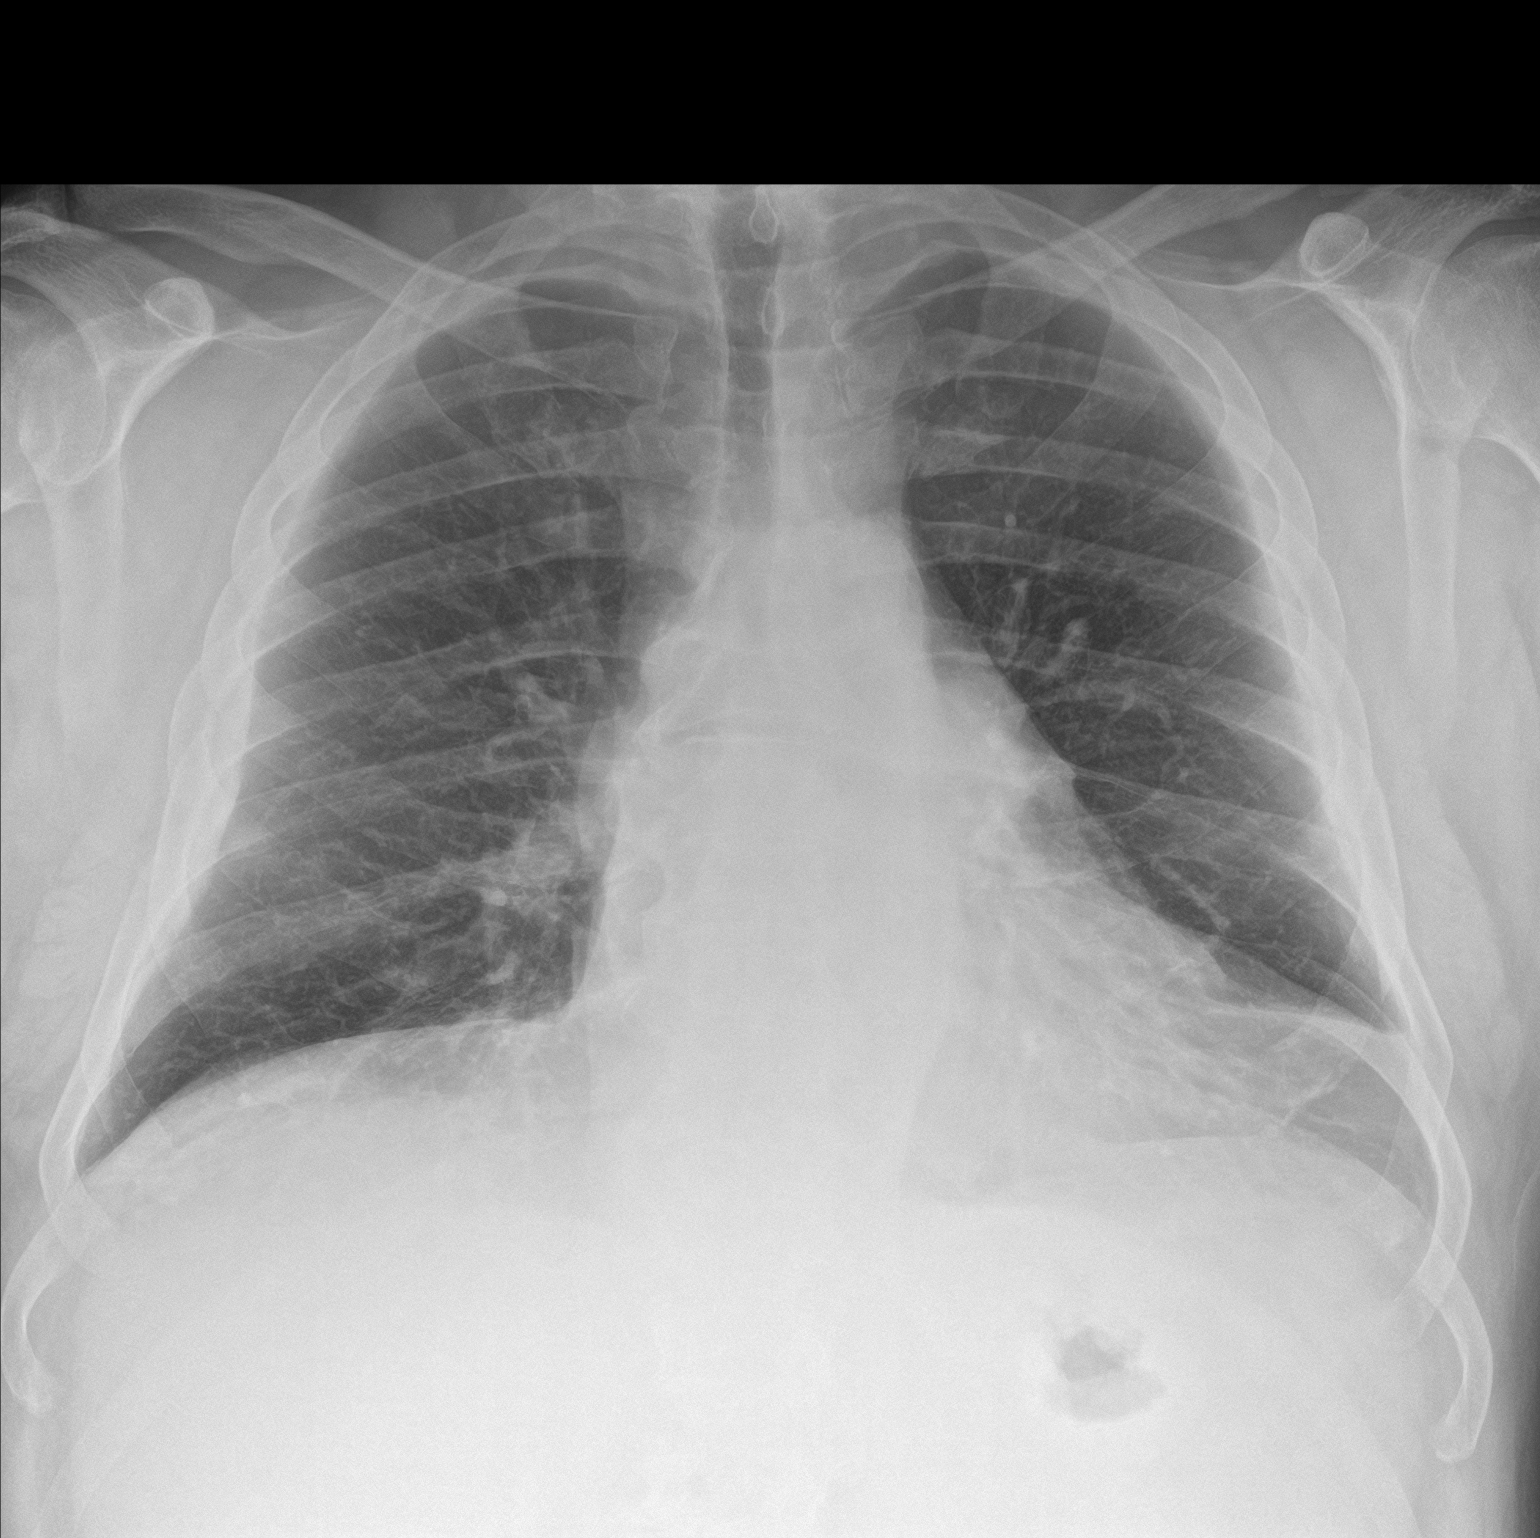

[chest lat]
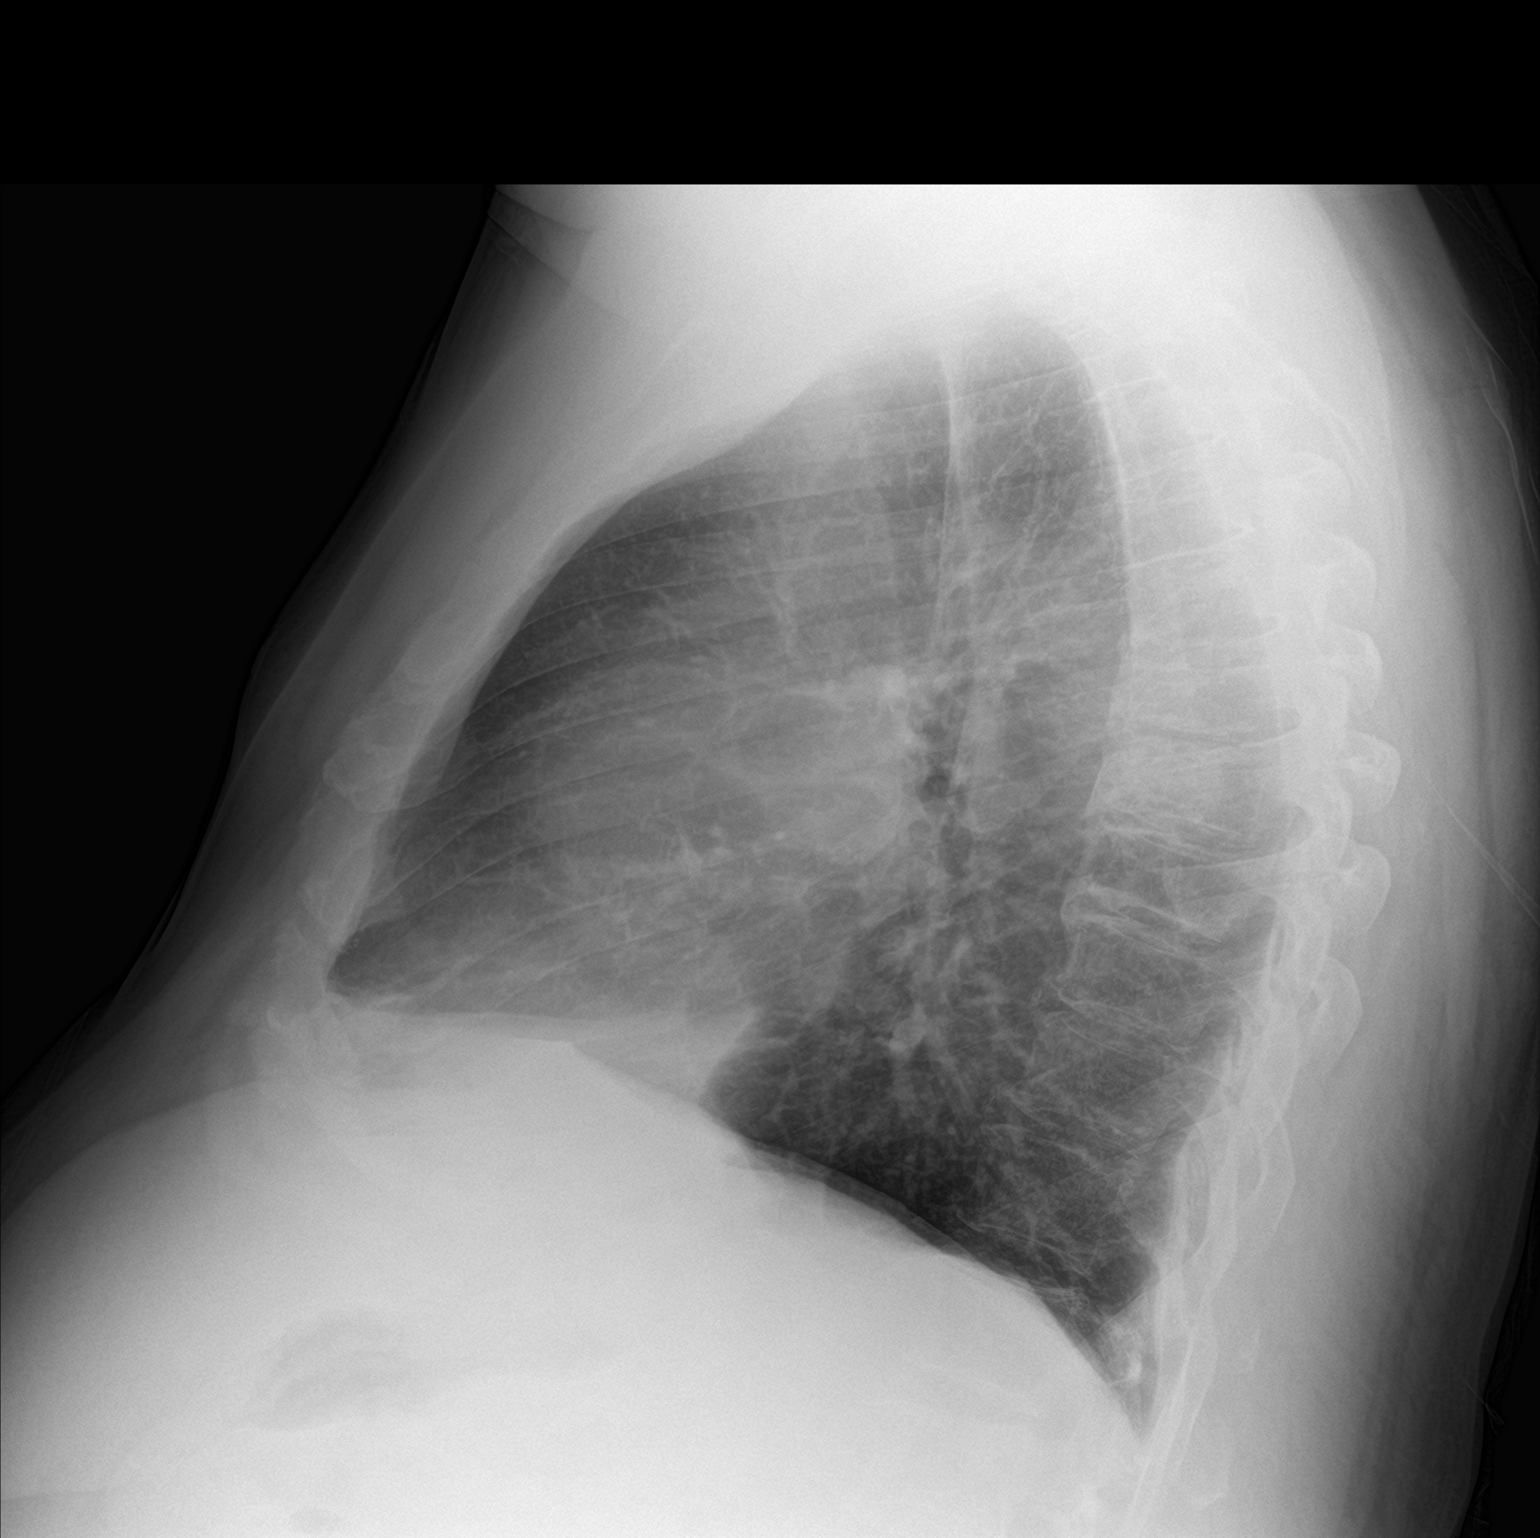

[2 of 2 positions shown; findings below may reference images not displayed]

FINDINGS: Minimal enlargement of cardiac silhouette.

Mediastinal contours and pulmonary vascularity normal.

Atelectasis at LEFT base.

Remaining lungs clear.

No pleural effusion or pneumothorax.

Scattered endplate spur formation thoracic spine.
IMPRESSION: Enlargement of cardiac silhouette with LEFT basilar atelectasis.

## 2021-10-22 ENCOUNTER — Ambulatory Visit (INDEPENDENT_AMBULATORY_CARE_PROVIDER_SITE_OTHER): Payer: 59 | Admitting: Gastroenterology

## 2021-10-22 ENCOUNTER — Encounter: Payer: Self-pay | Admitting: Gastroenterology

## 2021-10-22 VITALS — BP 104/60 | HR 88 | Ht 67.5 in | Wt 276.2 lb

## 2021-10-22 DIAGNOSIS — K625 Hemorrhage of anus and rectum: Secondary | ICD-10-CM

## 2021-10-22 DIAGNOSIS — K641 Second degree hemorrhoids: Secondary | ICD-10-CM | POA: Diagnosis not present

## 2021-10-22 DIAGNOSIS — K602 Anal fissure, unspecified: Secondary | ICD-10-CM

## 2021-10-22 MED ORDER — HYDROCORTISONE ACETATE 25 MG RE SUPP: RECTAL | 0 refills | Status: AC

## 2021-10-22 MED ORDER — AMBULATORY NON FORMULARY MEDICATION: 1 refills | Status: AC

## 2021-10-22 NOTE — Progress Notes (Signed)
Julian Parsons    [id number]    1961/03/17  Primary Care Physician:McKinney, Tammy, FNP  Referring Physician: Etter Sjogren, [address] [place] [address]   Chief complaint:  Rectal bleeding  HPI: 60 year old very pleasant gentleman here for new patient visit with complaints of rectal bleeding. He is accompanied by his wife  He has been having intermittent bright red blood per rectum, progressively worse in the past 2 weeks His  father passed away from colon cancer at age 39 Denies any rectal trauma or recent changes in bowel habits  He is on chronic anticoagulation with Xarelto for A. fib  Colonoscopy September 19, 2019 by Dr. Gala Romney for hematochezia Quality of bowel prep was adequate, 4 mm polyp was removed otherwise normal exam.  Did not have any enlarged or significant internal or external hemorrhoids  Colonoscopy January 11, 2016 by Dr. Gala Romney 1) 5 mm polyp in the rectum at 4 cm from the anal verge; otherwise, remainder of the rectal mucosa appeared normal. The patient had (1) 4 mm polyp in the base the cecum. Otherwise, the remainder of the colonic and rectal mucosa appeared normal.The above-mentioned polyps were cold snare removed and recovered. Retroflexion was performed. .   Outpatient Encounter Medications as of 10/22/2021  Medication Sig   escitalopram (LEXAPRO) 20 MG tablet Take 20 mg by mouth daily.     hydrochlorothiazide (HYDRODIURIL) 12.5 MG tablet Take 12.5 mg by mouth daily.   indomethacin (INDOCIN SR) 75 MG CR capsule Take 1 capsule by mouth daily as needed for moderate pain.    indomethacin (INDOCIN) 50 MG capsule Take 50 mg by mouth daily as needed for moderate pain.    IRON-VITAMIN C PO Take 1 tablet by mouth daily.   olmesartan (BENICAR) 40 MG tablet Take 40 mg by mouth daily.     Omega 3 1200 MG CAPS Take 1,200 mg by mouth daily.   pantoprazole (PROTONIX) 40 MG tablet Take 40 mg by mouth daily.    rosuvastatin (CRESTOR) 5 MG tablet  Take 5 mg by mouth daily.   No facility-administered encounter medications on file as of 10/22/2021.    Allergies as of 10/22/2021   (No Known Allergies)    Past Medical History:  Diagnosis Date   Anxiety    GERD (gastroesophageal reflux disease)    Hyperlipemia    Hypertension     Past Surgical History:  Procedure Laterality Date   COLONOSCOPY WITH PROPOFOL N/A 01/11/2016   Dr. Gala Romney: two tubular adenomas removed. next tcs in 5 years   COLONOSCOPY WITH PROPOFOL N/A 09/19/2019   Procedure: COLONOSCOPY WITH PROPOFOL;  Surgeon: Daneil Dolin, MD;  [facility];  Service: Endoscopy;  Laterality: N/A;  3:00pm   HIP SURGERY     right   LEG SURGERY     Right    POLYPECTOMY  09/19/2019   Procedure: POLYPECTOMY;  Surgeon: Daneil Dolin, MD;  Location: [facility];  Service: Endoscopy;;  colon    Family History  Problem Relation Age of Onset   Colon cancer Father        Unknown age of onset; Passed away in late 72s   Diabetes Mother     Social History   Socioeconomic History   Marital status: Married    Spouse name: Not on file   Number of children: 0   Years of education: Not on file   Highest education level: Not  on file  Occupational History   Occupation: Contractor    Comment: Self Employed  Tobacco Use   Smoking status: Never   Smokeless tobacco: Never  Substance and Sexual Activity   Alcohol use: Yes    Alcohol/week: 0.0 standard drinks    Comment: 6-8 beer daily    Drug use: No   Sexual activity: Not on file  Other Topics Concern   Not on file  Social History Narrative   3 caffeine drinks daily    Social Determinants of Health   Financial Resource Strain: Not on file  Food Insecurity: Not on file  Transportation Needs: Not on file  Physical Activity: Not on file  Stress: Not on file  Social Connections: Not on file  Intimate Partner Violence: Not on file      Review of systems: All other review of systems negative except as  mentioned in the HPI.   Physical Exam: Vitals:   10/22/21 1428  BP: 104/60  Pulse: 88   Body mass index is 42.63 kg/m. Gen:      No acute distress HEENT:  sclera anicteric Abd:      soft, non-tender; no palpable masses, no distension Ext:    No edema Neuro: alert and oriented x 3 Psych: normal mood and affect Rectal exam: Increased anal sphincter tone, tenderness no  external hemorrhoids Anoscopy: Grade 2 right anterior internal hemorrhoids, posterior anal fissure with active bleeding  Data Reviewed:  Reviewed labs, radiology imaging, old records and pertinent past GI work up   Assessment and Plan/Recommendations:  60 year old very pleasant gentleman with history of obesity, hypertension, hyperlipidemia, A. fib on chronic Xarelto, family history of colon cancer and personal history of colon polyps with bright red blood per rectum  On rectal exam he has grade 2 hemorrhoids and anal fissure Use small pea-sized amount of RectiCare and nitroglycerin 0.125% per rectum 3-4 times daily for 6 to 8 weeks Advised patient to avoid sildenafil or Cialis or any 5 phosphodiesterase inhibitors along with nitroglycerin to prevent serious adverse reaction  Use Anusol suppository per rectum after 7 to 10 days use of nitroglycerin for hemorrhoids  Use Benefiber 1 tablespoon 3 times daily with meals Avoid excessive straining during defecation  History of adenomatous colon polyps and family history of colon cancer: He is due for surveillance colonoscopy October 2025  Continue Xarelto  If continues to have persistent rectal bleeding, will consider colonoscopy for further evaluation  Return in 4 to 6 weeks  The patient was provided an opportunity to ask questions and all were answered. The patient agreed with the plan and demonstrated an understanding of the instructions.  Damaris Hippo , MD    CC: Etter Sjogren, FNP

## 2021-10-22 NOTE — Patient Instructions (Addendum)
We have sent a prescription for nitroglycerin 0.125% gel to [facility]. You should apply a pea size amount to your rectum three times daily x 6-8 weeks.  [facility]'s information is below: Address: [address], [place], [place] [address]  [phone]  *Please DO NOT go directly from our office to pick up this medication! Give the pharmacy 1 day to process the prescription as this is compounded and takes time to make.   Use Recticare OTC   Use Anusol Suppositories at bedtime for 7 days after the use of the Nitroglycerin   If you are age 58 or older, your body mass index should be between 23-30. Your Body mass index is 42.63 kg/m. If this is out of the aforementioned range listed, please consider follow up with your Primary Care Provider.  If you are age 60 or younger, your body mass index should be between 19-25. Your Body mass index is 42.63 kg/m. If this is out of the aformentioned range listed, please consider follow up with your Primary Care Provider.   ________________________________________________________  The [facility] providers would like to encourage you to use [facility] to communicate with providers for non-urgent requests or questions.  Due to long hold times on the telephone, sending your provider a message by [facility] may be a faster and more efficient way to get a response.  Please allow 48 business hours for a response.  Please remember that this is for non-urgent requests.   I appreciate the  opportunity to care for you  Thank You   Harl Bowie , MD  _______________________________________________________

## 2021-11-16 ENCOUNTER — Ambulatory Visit: Payer: 59 | Admitting: Internal Medicine
